# Patient Record
Sex: Male | Born: 1935 | Race: White | Hispanic: No | Marital: Married | Smoking: Never smoker
Health system: Southern US, Community
[De-identification: ages and names within clinical notes are randomized; demographics above are authoritative.]

## PROBLEM LIST (undated history)

## (undated) DIAGNOSIS — M4800 Spinal stenosis, site unspecified: Secondary | ICD-10-CM

## (undated) DIAGNOSIS — K5790 Diverticulosis of intestine, part unspecified, without perforation or abscess without bleeding: Secondary | ICD-10-CM

## (undated) DIAGNOSIS — M1990 Unspecified osteoarthritis, unspecified site: Secondary | ICD-10-CM

## (undated) DIAGNOSIS — K222 Esophageal obstruction: Secondary | ICD-10-CM

## (undated) DIAGNOSIS — K635 Polyp of colon: Secondary | ICD-10-CM

## (undated) DIAGNOSIS — I10 Essential (primary) hypertension: Secondary | ICD-10-CM

## (undated) DIAGNOSIS — R490 Dysphonia: Secondary | ICD-10-CM

## (undated) DIAGNOSIS — K219 Gastro-esophageal reflux disease without esophagitis: Secondary | ICD-10-CM

## (undated) HISTORY — PX: ROTATOR CUFF REPAIR: SHX139

## (undated) HISTORY — PX: PROSTATE SURGERY: SHX751

## (undated) HISTORY — DX: Spinal stenosis, site unspecified: M48.00

## (undated) HISTORY — PX: BACK SURGERY: SHX140

## (undated) HISTORY — DX: Polyp of colon: K63.5

## (undated) HISTORY — PX: KNEE ARTHROSCOPY: SHX127

## (undated) HISTORY — DX: Diverticulosis of intestine, part unspecified, without perforation or abscess without bleeding: K57.90

## (undated) HISTORY — DX: Esophageal obstruction: K22.2

## (undated) HISTORY — PX: APPENDECTOMY: SHX54

---

## 2001-06-11 ENCOUNTER — Encounter: Admission: RE | Admit: 2001-06-11 | Discharge: 2001-06-11 | Payer: Self-pay | Admitting: Orthopaedic Surgery

## 2001-06-11 ENCOUNTER — Emergency Department (HOSPITAL_COMMUNITY): Admission: EM | Admit: 2001-06-11 | Discharge: 2001-06-11 | Payer: Self-pay | Admitting: Emergency Medicine

## 2001-06-11 ENCOUNTER — Encounter: Payer: Self-pay | Admitting: Emergency Medicine

## 2001-06-19 ENCOUNTER — Encounter: Admission: RE | Admit: 2001-06-19 | Discharge: 2001-06-19 | Payer: Self-pay | Admitting: Orthopaedic Surgery

## 2001-09-26 ENCOUNTER — Ambulatory Visit (HOSPITAL_COMMUNITY): Admission: RE | Admit: 2001-09-26 | Discharge: 2001-09-26 | Payer: Self-pay | Admitting: Internal Medicine

## 2001-10-20 ENCOUNTER — Ambulatory Visit (HOSPITAL_COMMUNITY): Admission: RE | Admit: 2001-10-20 | Discharge: 2001-10-20 | Payer: Self-pay | Admitting: Cardiology

## 2002-02-04 ENCOUNTER — Inpatient Hospital Stay (HOSPITAL_COMMUNITY): Admission: RE | Admit: 2002-02-04 | Discharge: 2002-02-05 | Payer: Self-pay | Admitting: Orthopaedic Surgery

## 2007-06-30 ENCOUNTER — Ambulatory Visit: Payer: Self-pay | Admitting: Internal Medicine

## 2007-07-14 ENCOUNTER — Ambulatory Visit: Payer: Self-pay | Admitting: Internal Medicine

## 2007-07-14 ENCOUNTER — Encounter: Payer: Self-pay | Admitting: Internal Medicine

## 2009-10-17 ENCOUNTER — Inpatient Hospital Stay (HOSPITAL_COMMUNITY): Admission: RE | Admit: 2009-10-17 | Discharge: 2009-10-18 | Payer: Self-pay | Admitting: Orthopaedic Surgery

## 2011-01-15 LAB — COMPREHENSIVE METABOLIC PANEL
ALT: 20 U/L (ref 0–53)
Albumin: 4.4 g/dL (ref 3.5–5.2)
Alkaline Phosphatase: 49 U/L (ref 39–117)
Chloride: 103 mEq/L (ref 96–112)
Glucose, Bld: 101 mg/dL — ABNORMAL HIGH (ref 70–99)
Potassium: 4.9 mEq/L (ref 3.5–5.1)
Sodium: 139 mEq/L (ref 135–145)
Total Bilirubin: 1 mg/dL (ref 0.3–1.2)
Total Protein: 6.7 g/dL (ref 6.0–8.3)

## 2011-01-15 LAB — CBC
HCT: 43.5 % (ref 39.0–52.0)
Hemoglobin: 14.4 g/dL (ref 13.0–17.0)
MCHC: 33.2 g/dL (ref 30.0–36.0)
MCV: 85.8 fL (ref 78.0–100.0)
Platelets: 240 10*3/uL (ref 150–400)
RBC: 5.07 MIL/uL (ref 4.22–5.81)
RDW: 15.2 % (ref 11.5–15.5)
WBC: 6.4 10*3/uL (ref 4.0–10.5)

## 2011-01-15 LAB — URINALYSIS, ROUTINE W REFLEX MICROSCOPIC
Bilirubin Urine: NEGATIVE
Glucose, UA: NEGATIVE mg/dL
Hgb urine dipstick: NEGATIVE
Specific Gravity, Urine: 1.009 (ref 1.005–1.030)
pH: 6.5 (ref 5.0–8.0)

## 2011-01-15 LAB — DIFFERENTIAL
Basophils Relative: 1 % (ref 0–1)
Eosinophils Absolute: 0.2 10*3/uL (ref 0.0–0.7)
Monocytes Absolute: 0.5 10*3/uL (ref 0.1–1.0)
Monocytes Relative: 7 % (ref 3–12)

## 2011-03-02 NOTE — Op Note (Signed)
. [facility]  Patient:    Christian [name], Christian [name] Visit Number: [id number] MRN: [id number]          Service Type: SUR Location: 5000 5005 [id number] Attending Physician:  Jacki Cones Dictated by:   Veverly Fells Ophelia Charter, M.D. Proc. Date: 02/04/02 Admit Date:  02/04/2002 Discharge Date: 02/05/2002                             Operative Report  PREOPERATIVE DIAGNOSIS:  L4-5 lumbar spinal stenosis with bilateral foraminal stenosis.  POSTOPERATIVE DIAGNOSIS:  L4-5 lumbar spinal stenosis with bilateral foraminal stenosis.  OPERATION PERFORMED:  L4-5 decompression with foraminotomies.  SURGEON:  Mark C. Ophelia Charter, M.D.  ASSISTANT:  Zonia Kief, PA-C  ANESTHESIA:  GOT.  ESTIMATED BLOOD LOSS:  100 ml.  DESCRIPTION OF PROCEDURE:  After induction of general anesthesia, the patient was placed on the Andrews frame with preoperative Ancef prophylaxis.  His back was prepped with DuraPrep and the patient was carefully position, padded.  The area was squared with towels, Betadine Vi-drape applied and laminectomy sheets and drapes were applied.  Needle localization with a spinal needle, cross-table lateral x-ray confirmed that the needle was exactly over the L4-5 level.  A midline incision was made. Subperiosteal dissection on the lamina was performed.  A self-retaining retractor was placed.  Inferior aspect of the spinous process of L4 was taken and the top one fourth of L5.  There was considerable facet overgrowth even posteriorly and the interlaminar space was extremely small.  Bone was removed down to the base of the spinous process at the inferior one third of L4 and the lamina was thinned with the Kerrison.  There was extremely hypertrophic ligamentum and laminotomy was performed with both the right and left side as well as in the midline removing the inferior one half of the lamina.  The top fourth of the L5 lamina was also removed and bone was removed out to  the gutters.  There was some evidence of previous corticosteroid injections interspersed with the ligamentum and there was a large piece of calcified ligament which was 1 x 1 cm and was sitting on the right side.  This was adherent to the dura and with the operative microscope draped in brought in, microdissection technique was used to remove the calcified ligament, gently using a #4 Penfield to release adhesions off of the dura.  It was peeled off, grasped with a Kerrison and removed.  After that, large chunks of ligament were removed, patties used to protect the dura and there was extremely thick ligament as expected and corresponded with myelo-CT.  Bone spurs hung off the facets toward the midline, causing triangular constriction of the dura.  These were removed back to the level of pedicle without damaging the facet.  Bone was removed out to the pedicle on both the right and left side.  Foramina was palpated.  There was considerable anterolisthesis, step off that corresponded with the preoperative 3 to 4 mm.  The patient was stable in flexion and extension preoperative x-rays and cross-table lateral did show any change in the listhesis.  The disk was not herniated and was sloped at an angle as expected.  The nerve root was carefully inspected, gently retracted and palpated on all sides.  There was no disk material and all ligament had been removed.  The scope was moved around, right and left, up and down to give excellent visualization of  the canal.  Passes with the  hockey stick anterior to the dura was performed in the foramina one last time.  There were no areas of either central or residual foraminal encroachment.  Irrigation was used intermittently throughout the case to keep the dura moist and a final several syringes of irrigation prior to closure.  0 Vicryl was placed in the deep fascia, 2-0 Vicryl in the subcutaneous tissue.  Marcaine infiltration of the skin, skin staple  closure and postoperative dressing.  The instrument and needle counts were correct.  The patient was transferred to the recovery room in stable condition. Dictated by:   Veverly Fells Ophelia Charter, M.D. Attending Physician:  Jacki Cones DD:  02/04/02 TD:  02/04/02 Job: [id number]

## 2011-03-02 NOTE — H&P (Signed)
[place]. [facility]  Patient:    Christian [name], Christian [name] Visit Number: [id number] MRN: [id number]          Service Type: Attending:  Darden Palmer., M.D. Dictated by:   Darden Palmer., M.D. Adm. Date:  10/20/01   CC:         Richard A. Jacky Kindle, M.D.   History and Physical  CHIEF COMPLAINT: For catheterization.  HISTORY OF PRESENT ILLNESS: This is a 77 year old male, seen and admitted for catheterization.  He has had significant low back pain and had progressive exertional fatigue, and some pressure in his chest described as a band-like feeling which would occur after eating a heavy meal, and would be relieved with rest.  Dr. Jacky Kindle did a treadmill test earlier last month, showing an exercise of seven minutes and 44 seconds with 2 mm ST depression anteriorly. He was seen in the office and we recommended catheterization for further evaluation.  PAST MEDICAL HISTORY: Negative for hypertension, hyperlipidemia, or diabetes. Significant for low back pain, and is contemplating surgery.  PAST SURGICAL HISTORY:  1. Appendectomy at age 78.  2. Hemorrhoidectomy in 36.  3. Bakers cyst of right knee July 16, 2001.  ALLERGIES: None.  CURRENT MEDICATIONS: Aspirin.  FAMILY HISTORY: Father died in his 79s of typhoid fever.  Mother died at age 53 with heart disease and asthma.  One son and daughter, who are healthy.  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  He normally is active and gets no regular exercise.  He is a nonsmoker and does not use alcohol to excess.  Drinks occasional caffeine.  REVIEW OF SYSTEMS: Significant for erectile dysfunction, for which he uses Viagra.  He has seen Dr. Marcelyn Bruins for prostatism in the past.  He is hoarse and has complained of significant sinus drainage.  He has low back pain and is contemplating an operation.  Some numbness involving his legs, which is worse when he exercises,  attributed to his back.  PHYSICAL EXAMINATION:  GENERAL: Pleasant male.  VITAL SIGNS: Weight 178-1/2 pounds.  Blood pressure 160/80 sitting, 154/78 standing.  Pulse 78.  SKIN: Warm and dry.  HEENT: Unremarkable.  Pharynx negative.  NECK: Supple without masses, JVD, thyromegaly, or bruits.  LYMPH NODES: Negative.  LUNGS: Clear to A&P.  CARDIAC: Normal S1 and S2, no S3 or murmur.  ABDOMEN: Soft, nontender.  No masses or hepatosplenomegaly.  EXTREMITIES: Femoral and distal pulses present and 2+.  NEUROLOGIC: Normal.  IMPRESSION:  1. Exertional dyspnea and fatigue, with an abnormal treadmill test; rule out     coronary disease.  2. Significant low back pain.  3. Erectile dysfunction.  PLAN: Cardiac catheterization.  The procedure was discussed fully with the patient including risks of MI, death, or CVA, and he is agreeable and willing to proceed.  The possibility of angioplasty and stenting was discussed with the patient. Dictated by:   Darden Palmer., M.D. Attending:  Darden Palmer., M.D. DD:  10/09/01 TD:  10/10/01 Job: [id number]

## 2011-03-02 NOTE — H&P (Signed)
[place]. [facility]  Patient:    WILLIAMSON, CAVANAH Visit Number: [id number] MRN: [id number]          Service Type: SUR Location: 5000 5005 01 Attending Physician:  Jacki Cones Dictated by:   Genene Churn. Barry Dienes, P.A.C. Admit Date:  02/04/2002                           History and Physical  CHIEF COMPLAINT:  Low back pain and bilateral buttock pain.  HISTORY OF PRESENT ILLNESS:  A 77 year old white male with a history of L4-5 stenosis, persistent low back pain, and bilateral buttock pain presents for a preoperative evaluation for an L4-5 decompression and bilateral foraminotomies. He has had progressively worsening pain and has had no response to conservative treatment.  Significant decrease in his daily activity levels due to his ongoing symptoms.  MEDICATIONS: 1. Multivitamin. 2. Aspirin one tablet p.o. q.d. 3. Naprosyn. 4. Altace 2.5 mg p.o. q.d.  ALLERGIES:  TYLOX - nausea and vomiting.  PAST SURGICAL HISTORY: 1. Hemorrhoidectomy in 1975. 2. Right knee arthroscopy in 2002.  FAMILY HISTORY:  Positive for coronary artery disease.  SOCIAL HISTORY:  The patient is married and he is currently retired.  Denies smoking or alcohol consumption.  REVIEW OF SYSTEMS:  Positive for prostate problems.  The patient states that he had a heart catheterization performed in January of 2003 by Dr. Donnie Aho. He states that this test was negative.  He currently denies any chest pain, shortness of breath, or abdominal pain.  All other review of systems are noncontributory.  PHYSICAL EXAMINATION:  VITAL SIGNS:  Temperature 97.2, pulse 72, respirations 20, and blood pressure 149/96.  Height 5 feet 10 inches, weight 170 pounds.  GENERAL:  Pleasant, elderly white male who is alert and oriented x 3 and in no acute distress.  HEENT:  Head is normocephalic and atraumatic.  Extraocular muscles are intact. Tympanic membranes are clear, pearly gray, and intact.   Oral mucosa pink and moist.  NECK:  Full range of motion.  Supple and nontender.  Carotids are intact.  No lymphadenopathy.  LUNGS:  Clear to auscultation bilaterally.  No wheezes, rales, or rhonchi.  HEART:  Respiratory rate.  S1 and S2.  No murmurs, rubs, or gallops noted.  ABDOMEN:  Round and nondistended.  NABS x2.  Soft and nontender.  No palpable masses or organomegaly.  EXTREMITIES:  On examination he has a bilateral sciatic notch tenderness. Demonstrates good range of motion of all extremities.  He has 2+ DTRs. No isolated motor weakness.  Pulses are intact.  SKIN:  Warm and dry.  No lesions or ulcerations noted.  Previous MRI, CT, and myelogram showed critical stenosis and grade I listhesis of L4.  Stenosis at L4-5.  ASSESSMENT:  L4-5 stenosis.  PLAN:  L4-5 decompression and bilateral foraminotomies.  Operative versus nonoperative treatments were discussed with the patient. Possible risks and complications such as bleeding, infection, nerve damage, paralysis, fractures, and reoperation also discussed.  All questions answered and he wishes to proceed with surgery at this time.  We contacted Dr. Tawana Scale office for a surgical clearance and this was received. Dictated by:   Genene Churn. Barry Dienes, P.A.C. Attending Physician:  Jacki Cones DD:  02/04/02 TD:  02/04/02 Job: [phone]

## 2011-03-02 NOTE — Cardiovascular Report (Signed)
[place]. [facility]  Patient:    Christian [name], Christian [name] Visit Number: [id number] MRN: [id number]          Service Type: CAT Location: [facility] 2899 12 Attending Physician:  Norman Clay Dictated by:   Darden Palmer., M.D. Proc. Date: 10/20/01 Admit Date:  10/20/2001   CC:         Richard A. Jacky Kindle, M.D.   Cardiac Catheterization  HISTORY:  This is a 77 year old male with progressive exertional dyspnea.  He has had abnormal stress test and study is done to exclude coronary artery disease as the cause.  COMMENTS ABOUT PROCEDURE:  The patient tolerated the cardiac catheterization procedure well without complications.  Following the procedure, he had good hemostasis and peripheral pulses noted.  HEMODYNAMIC DATA:  Aorta postcontrast 140/80; left ventricle 140/12.  ANGIOGRAPHIC DATA:  LEFT VENTRICULOGRAM:  Performed in the 30 degree RAO projection.  The aortic valve was normal.  The mitral valve was normal.  The left ventricle was normal in size.  The estimated ejection fraction was 65%.  CORONARY ARTERIES:  The coronary arteries arise and distribute normally. There was calcification noted in both the left and the right coronary artery systems.  The left main coronary artery is normal.  The left anterior descending is mildly calcified.  There is a proximal 20-30% stenosis and a 30-40% mid vessel stenosis noted.  The circumflex has scattered irregularities with mild calcification.  The right coronary artery ends in a posterior descending artery and has a mid vessel 30% stenosis.  IMPRESSIONS: 1. Mild coronary artery disease, predominantly involving the left anterior    descending artery and right coronary artery. 2. Normal left ventricular function. Dictated by:   Darden Palmer., M.D. Attending Physician:  Norman Clay DD:  10/20/01 TD:  10/20/01 Job: [id number]

## 2012-06-12 ENCOUNTER — Encounter: Payer: Self-pay | Admitting: Internal Medicine

## 2012-09-23 ENCOUNTER — Encounter: Payer: Self-pay | Admitting: Internal Medicine

## 2012-10-20 ENCOUNTER — Ambulatory Visit: Payer: Self-pay | Admitting: Internal Medicine

## 2012-11-06 ENCOUNTER — Ambulatory Visit (INDEPENDENT_AMBULATORY_CARE_PROVIDER_SITE_OTHER): Payer: Medicare Other | Admitting: Internal Medicine

## 2012-11-06 ENCOUNTER — Encounter: Payer: Self-pay | Admitting: Internal Medicine

## 2012-11-06 VITALS — BP 130/70 | HR 68 | Ht 70.0 in | Wt 172.8 lb

## 2012-11-06 DIAGNOSIS — Z1211 Encounter for screening for malignant neoplasm of colon: Secondary | ICD-10-CM

## 2012-11-06 NOTE — Patient Instructions (Addendum)
Please follow up with Dr. Perry as needed 

## 2012-11-06 NOTE — Progress Notes (Signed)
HISTORY OF PRESENT ILLNESS:  Christian [name] is a 79 y.o. male with the below listed medical history who presents today to discuss surveillance colonoscopy. The patient last underwent complete colonoscopy with good preparation September 2008. He was found to have 3 diminutive colon polyps which were removed and found to be adenomatous. As well severe left-sided diverticulosis. Followup in 5 years to be considered if the patient was medically fit and willing. He received a recall letter and is responding. He has had no significant interval medical problems, except for BPH that required successful TURP. Remains active. His GI review of systems is entirely negative except for history of GERD for which he takes Prilosec with excellent control of symptoms. Remote EGD in 2003 was negative except for large caliber distal esophageal ring. Review patient's outside records reveal unremarkable laboratories from last week including a normal hemoglobin of 13.8. Hemoccult studies are pending.  REVIEW OF SYSTEMS:  All non-GI ROS negative except for back pain  Past Medical History  Diagnosis Date  . Diverticulosis   . Colon polyps   . Esophageal stricture   . Spinal stenosis     Past Surgical History  Procedure Date  . Appendectomy   . Back surgery     x2  . Knee arthroscopy   . Rotator cuff repair   . Prostate surgery     Social History Christian [name]  reports that he has never smoked. He has never used smokeless tobacco. He reports that he does not drink alcohol or use illicit drugs.  family history includes Heart disease in his mother.  No Known Allergies     PHYSICAL EXAMINATION: Vital signs: BP 130/70  Pulse 68  Ht 5\' 10"  (1.778 m)  Wt 172 lb 12.8 oz (78.382 kg)  BMI 24.79 kg/m2 General: Well-developed, well-nourished, no acute distress HEENT: Sclerae are anicteric, conjunctiva pink. Oral mucosa intact Lungs: Clear Heart: Regular Abdomen: soft, nontender, nondistended, no obvious  ascites, no peritoneal signs, normal bowel sounds. No organomegaly. Extremities: No edema Psychiatric: alert and oriented x3. Cooperative      ASSESSMENT:  #1. Personal history of diminutive adenomatous polyps September 2008. Presents today regarding the need for surveillance colonoscopy.   PLAN:  #1. We discussed the pros and cons of proceeding with surveillance colonoscopy at this time. I do feel that he is medically fit to undergo such. It is not clear what if any benefit he would derive at age 79. He is asymptomatic without any worrisome features and normal laboratories. This is preference to forego colonoscopy unless absolutely necessary. I reviewed with him current national guidelines. As such, he has elected to forego the examination at this time. I support this choice. Future followup as needed

## 2015-04-28 ENCOUNTER — Other Ambulatory Visit: Payer: Self-pay | Admitting: Orthopaedic Surgery

## 2015-04-28 DIAGNOSIS — M25561 Pain in right knee: Secondary | ICD-10-CM

## 2015-05-02 ENCOUNTER — Ambulatory Visit
Admission: RE | Admit: 2015-05-02 | Discharge: 2015-05-02 | Disposition: A | Discharge status: Home / Self Care | Payer: Medicare Other | Source: Ambulatory Visit | Attending: Orthopaedic Surgery | Admitting: Orthopaedic Surgery

## 2015-05-02 DIAGNOSIS — M25561 Pain in right knee: Secondary | ICD-10-CM

## 2015-05-13 ENCOUNTER — Other Ambulatory Visit: Payer: Self-pay | Admitting: Surgery

## 2015-05-13 DIAGNOSIS — M545 Low back pain: Secondary | ICD-10-CM

## 2015-05-20 ENCOUNTER — Other Ambulatory Visit (HOSPITAL_COMMUNITY): Payer: Self-pay | Admitting: Orthopaedic Surgery

## 2015-05-24 ENCOUNTER — Other Ambulatory Visit: Payer: Medicare Other

## 2015-05-26 ENCOUNTER — Encounter (HOSPITAL_COMMUNITY)
Admission: RE | Admit: 2015-05-26 | Discharge: 2015-05-26 | Disposition: A | Discharge status: Home / Self Care | Payer: Medicare Other | Source: Ambulatory Visit | Attending: Orthopaedic Surgery | Admitting: Orthopaedic Surgery

## 2015-05-26 ENCOUNTER — Other Ambulatory Visit (HOSPITAL_COMMUNITY): Payer: Medicare Other

## 2015-05-26 ENCOUNTER — Encounter (HOSPITAL_COMMUNITY): Payer: Self-pay

## 2015-05-26 ENCOUNTER — Ambulatory Visit (HOSPITAL_COMMUNITY)
Admission: RE | Admit: 2015-05-26 | Discharge: 2015-05-26 | Disposition: A | Discharge status: Home / Self Care | Payer: Medicare Other | Source: Ambulatory Visit | Attending: Orthopaedic Surgery | Admitting: Orthopaedic Surgery

## 2015-05-26 DIAGNOSIS — Z01818 Encounter for other preprocedural examination: Secondary | ICD-10-CM | POA: Diagnosis present

## 2015-05-26 DIAGNOSIS — M179 Osteoarthritis of knee, unspecified: Secondary | ICD-10-CM | POA: Insufficient documentation

## 2015-05-26 DIAGNOSIS — Z01812 Encounter for preprocedural laboratory examination: Secondary | ICD-10-CM | POA: Insufficient documentation

## 2015-05-26 DIAGNOSIS — I10 Essential (primary) hypertension: Secondary | ICD-10-CM | POA: Diagnosis not present

## 2015-05-26 DIAGNOSIS — R9431 Abnormal electrocardiogram [ECG] [EKG]: Secondary | ICD-10-CM | POA: Insufficient documentation

## 2015-05-26 DIAGNOSIS — M1710 Unilateral primary osteoarthritis, unspecified knee: Secondary | ICD-10-CM

## 2015-05-26 HISTORY — DX: Unspecified osteoarthritis, unspecified site: M19.90

## 2015-05-26 HISTORY — DX: Essential (primary) hypertension: I10

## 2015-05-26 HISTORY — DX: Gastro-esophageal reflux disease without esophagitis: K21.9

## 2015-05-26 HISTORY — DX: Dysphonia: R49.0

## 2015-05-26 LAB — CBC
HEMATOCRIT: 46.1 % (ref 39.0–52.0)
Hemoglobin: 15.6 g/dL (ref 13.0–17.0)
MCH: 30.1 pg (ref 26.0–34.0)
MCHC: 33.8 g/dL (ref 30.0–36.0)
MCV: 89 fL (ref 78.0–100.0)
Platelets: 201 10*3/uL (ref 150–400)
RBC: 5.18 MIL/uL (ref 4.22–5.81)
RDW: 13.9 % (ref 11.5–15.5)
WBC: 7 10*3/uL (ref 4.0–10.5)

## 2015-05-26 LAB — URINALYSIS, ROUTINE W REFLEX MICROSCOPIC
BILIRUBIN URINE: NEGATIVE
GLUCOSE, UA: NEGATIVE mg/dL
Hgb urine dipstick: NEGATIVE
KETONES UR: NEGATIVE mg/dL
Nitrite: NEGATIVE
PROTEIN: NEGATIVE mg/dL
Specific Gravity, Urine: 1.014 (ref 1.005–1.030)
UROBILINOGEN UA: 0.2 mg/dL (ref 0.0–1.0)
pH: 5.5 (ref 5.0–8.0)

## 2015-05-26 LAB — COMPREHENSIVE METABOLIC PANEL
ALT: 22 U/L (ref 17–63)
ANION GAP: 8 (ref 5–15)
AST: 33 U/L (ref 15–41)
Albumin: 4.3 g/dL (ref 3.5–5.0)
Alkaline Phosphatase: 63 U/L (ref 38–126)
BILIRUBIN TOTAL: 0.8 mg/dL (ref 0.3–1.2)
BUN: 20 mg/dL (ref 6–20)
CO2: 27 mmol/L (ref 22–32)
CREATININE: 1.34 mg/dL — AB (ref 0.61–1.24)
Calcium: 9.5 mg/dL (ref 8.9–10.3)
Chloride: 104 mmol/L (ref 101–111)
GFR calc Af Amer: 54 mL/min — ABNORMAL LOW (ref >60)
GFR calc non Af Amer: 47 mL/min — ABNORMAL LOW (ref >60)
Glucose, Bld: 117 mg/dL — ABNORMAL HIGH (ref 65–99)
Potassium: 4.6 mmol/L (ref 3.5–5.1)
Sodium: 139 mmol/L (ref 135–145)
TOTAL PROTEIN: 6.6 g/dL (ref 6.5–8.1)

## 2015-05-26 LAB — PROTIME-INR
INR: 1.05 (ref 0.00–1.49)
PROTHROMBIN TIME: 13.9 s (ref 11.6–15.2)

## 2015-05-26 LAB — SURGICAL PCR SCREEN
MRSA, PCR: NEGATIVE
Staphylococcus aureus: NEGATIVE

## 2015-05-26 LAB — URINE MICROSCOPIC-ADD ON

## 2015-05-26 NOTE — Progress Notes (Signed)
Pt. Followed by Dr. Jacky Kindle for PCP, he will see him on 06/02/2015. Pt. Reports stress test in 2003 led to cardiac cath. Cardiac cath. produced wnl results.

## 2015-05-26 NOTE — Pre-Procedure Instructions (Signed)
Christian [name]  05/26/2015      [facility] [place], [place] - [address] [facility] [address] Phone: [phone] Fax: [phone]    Your procedure is scheduled on 06/06/2015   Report to [facility] Admitting at 10:30 A.M.   Call this number if you have problems the morning of surgery:  [phone]   Remember:  Do not eat food or drink liquids after midnight. On .  Place clean sheets on your bed the night of your first shower and do not sleep with pets.  Day of Surgery  Do not apply any lotions/deodorants the morning of surgery.  Please wear clean clothes to the hospital/surgery center.  Please read over the following fact sheets that you were given. Pain Booklet, Coughing and Deep Breathing, MRSA Information and Surgical Site Infection Prevention

## 2015-05-26 NOTE — Progress Notes (Signed)
Report to A. Kabbe, Anesth. Consult, on the EKG done today at PAT appt.

## 2015-05-26 NOTE — Progress Notes (Signed)
Requested last EKG & last OV note  from Dr. Lanell Matar office, left voice mail for assist in Med. Records.

## 2015-05-27 NOTE — Progress Notes (Signed)
Anesthesia Chart Review:  Pt is 81 year old male scheduled for R computer assisted total knee arthroplasty on 06/06/2015 with Dr. Ophelia Charter.   PCP is Dr. [name].  PMH includes: HTN, GERD. Never smoker. BMI 25.5.  Medications include: lisinopril, prilosec.   Preoperative labs reviewed.    Chest x-ray 05/26/2015 reviewed. Nonspecific elevation of the R diaphragm of uncertain etiology with potential causes including hepatic mass or diaphragmatic paralysis.   EKG 05/26/2015: sinus rhythm with PACs. LAD. Appears similar to prior tracings dated 08/10/2011 and 01/29/2002.   If no changes, I anticipate pt can proceed with surgery as scheduled.   Rica Mast, FNP-BC [facility]/Anesthesiology Phone: [phone] 05/27/2015 1:54 PM

## 2015-06-03 MED ORDER — CHLORHEXIDINE GLUCONATE 4 % EX LIQD: 60.0000 mL | Freq: Once | CUTANEOUS | Status: DC

## 2015-06-03 MED ORDER — CEFAZOLIN SODIUM-DEXTROSE 2-3 GM-% IV SOLR
2.0000 g | INTRAVENOUS | Status: AC
  Administered 2015-06-06: 2 g via INTRAVENOUS
  Filled 2015-06-03: qty 50

## 2015-06-06 ENCOUNTER — Inpatient Hospital Stay (HOSPITAL_COMMUNITY): Payer: Medicare Other | Admitting: Emergency Medicine

## 2015-06-06 ENCOUNTER — Encounter (HOSPITAL_COMMUNITY)
Admission: AD | Disposition: A | Discharge status: Home Health | Payer: Self-pay | Source: Ambulatory Visit | Attending: Orthopaedic Surgery

## 2015-06-06 ENCOUNTER — Inpatient Hospital Stay (HOSPITAL_COMMUNITY)
Admission: AD | Admit: 2015-06-06 | Discharge: 2015-06-09 | DRG: 470 | Disposition: A | Discharge status: Home Health | Payer: Medicare Other | Source: Ambulatory Visit | Attending: Orthopaedic Surgery | Admitting: Orthopaedic Surgery

## 2015-06-06 ENCOUNTER — Encounter (HOSPITAL_COMMUNITY): Payer: Self-pay | Admitting: *Deleted

## 2015-06-06 ENCOUNTER — Inpatient Hospital Stay (HOSPITAL_COMMUNITY): Payer: Medicare Other | Admitting: Anesthesiology

## 2015-06-06 DIAGNOSIS — K219 Gastro-esophageal reflux disease without esophagitis: Secondary | ICD-10-CM | POA: Diagnosis present

## 2015-06-06 DIAGNOSIS — I10 Essential (primary) hypertension: Secondary | ICD-10-CM | POA: Diagnosis present

## 2015-06-06 DIAGNOSIS — M1711 Unilateral primary osteoarthritis, right knee: Principal | ICD-10-CM | POA: Diagnosis present

## 2015-06-06 DIAGNOSIS — Z8249 Family history of ischemic heart disease and other diseases of the circulatory system: Secondary | ICD-10-CM

## 2015-06-06 DIAGNOSIS — M25561 Pain in right knee: Secondary | ICD-10-CM | POA: Diagnosis present

## 2015-06-06 DIAGNOSIS — Z96659 Presence of unspecified artificial knee joint: Secondary | ICD-10-CM

## 2015-06-06 HISTORY — PX: KNEE ARTHROPLASTY: SHX992

## 2015-06-06 SURGERY — ARTHROPLASTY, KNEE, TOTAL, USING IMAGELESS COMPUTER-ASSISTED NAVIGATION
Anesthesia: General | Site: Knee | Laterality: Right

## 2015-06-06 MED ORDER — LISINOPRIL 10 MG PO TABS
10.0000 mg | ORAL_TABLET | Freq: Every day | ORAL | Status: DC
  Administered 2015-06-06 – 2015-06-08 (×3): 10 mg via ORAL
  Filled 2015-06-06 (×4): qty 1

## 2015-06-06 MED ORDER — EPHEDRINE SULFATE 50 MG/ML IJ SOLN
INTRAMUSCULAR | Status: AC
  Filled 2015-06-06: qty 1

## 2015-06-06 MED ORDER — PHENOL 1.4 % MT LIQD: 1.0000 | OROMUCOSAL | Status: DC | PRN

## 2015-06-06 MED ORDER — ONDANSETRON HCL 4 MG/2ML IJ SOLN
4.0000 mg | Freq: Four times a day (QID) | INTRAMUSCULAR | Status: DC | PRN
  Administered 2015-06-06: 4 mg via INTRAVENOUS
  Filled 2015-06-06: qty 2

## 2015-06-06 MED ORDER — MIDAZOLAM HCL 2 MG/2ML IJ SOLN
INTRAMUSCULAR | Status: AC
  Filled 2015-06-06: qty 4

## 2015-06-06 MED ORDER — METOCLOPRAMIDE HCL 5 MG/ML IJ SOLN: 5.0000 mg | Freq: Three times a day (TID) | INTRAMUSCULAR | Status: DC | PRN

## 2015-06-06 MED ORDER — ACETAMINOPHEN 650 MG RE SUPP: 650.0000 mg | Freq: Four times a day (QID) | RECTAL | Status: DC | PRN

## 2015-06-06 MED ORDER — ONDANSETRON HCL 4 MG/2ML IJ SOLN
INTRAMUSCULAR | Status: AC
  Administered 2015-06-06: 4 mg via INTRAVENOUS
  Filled 2015-06-06: qty 2

## 2015-06-06 MED ORDER — SODIUM CHLORIDE 0.9 % IJ SOLN
INTRAMUSCULAR | Status: AC
  Filled 2015-06-06: qty 10

## 2015-06-06 MED ORDER — SODIUM CHLORIDE 0.9 % IR SOLN
Status: DC | PRN
  Administered 2015-06-06: 3000 mL

## 2015-06-06 MED ORDER — DOCUSATE SODIUM 100 MG PO CAPS
100.0000 mg | ORAL_CAPSULE | Freq: Two times a day (BID) | ORAL | Status: DC
  Administered 2015-06-06 – 2015-06-09 (×6): 100 mg via ORAL
  Filled 2015-06-06 (×7): qty 1

## 2015-06-06 MED ORDER — PHENYLEPHRINE HCL 10 MG/ML IJ SOLN
INTRAMUSCULAR | Status: DC | PRN
  Administered 2015-06-06: 40 ug via INTRAVENOUS
  Administered 2015-06-06: 80 ug via INTRAVENOUS

## 2015-06-06 MED ORDER — ASPIRIN EC 325 MG PO TBEC
325.0000 mg | DELAYED_RELEASE_TABLET | Freq: Every day | ORAL | Status: DC
  Administered 2015-06-07 – 2015-06-09 (×3): 325 mg via ORAL
  Filled 2015-06-06 (×3): qty 1

## 2015-06-06 MED ORDER — FENTANYL CITRATE (PF) 100 MCG/2ML IJ SOLN
100.0000 ug | Freq: Once | INTRAMUSCULAR | Status: AC
  Administered 2015-06-06: 100 ug via INTRAVENOUS

## 2015-06-06 MED ORDER — BISACODYL 10 MG RE SUPP: 10.0000 mg | Freq: Every day | RECTAL | Status: DC | PRN

## 2015-06-06 MED ORDER — LIDOCAINE HCL (CARDIAC) 20 MG/ML IV SOLN
INTRAVENOUS | Status: DC | PRN
  Administered 2015-06-06: 50 mg via INTRAVENOUS

## 2015-06-06 MED ORDER — MIDAZOLAM HCL 2 MG/2ML IJ SOLN
INTRAMUSCULAR | Status: AC
  Filled 2015-06-06: qty 2

## 2015-06-06 MED ORDER — ONDANSETRON HCL 4 MG/2ML IJ SOLN
4.0000 mg | Freq: Once | INTRAMUSCULAR | Status: AC
  Administered 2015-06-06: 4 mg via INTRAVENOUS
  Filled 2015-06-06: qty 2

## 2015-06-06 MED ORDER — METHOCARBAMOL 1000 MG/10ML IJ SOLN: 500.0000 mg | Freq: Four times a day (QID) | INTRAMUSCULAR | Status: DC | PRN

## 2015-06-06 MED ORDER — OMEPRAZOLE MAGNESIUM 20 MG PO TBEC: 20.0000 mg | DELAYED_RELEASE_TABLET | Freq: Every day | ORAL | Status: DC

## 2015-06-06 MED ORDER — 0.9 % SODIUM CHLORIDE (POUR BTL) OPTIME
TOPICAL | Status: DC | PRN
  Administered 2015-06-06: 1000 mL

## 2015-06-06 MED ORDER — ONDANSETRON HCL 4 MG/2ML IJ SOLN
INTRAMUSCULAR | Status: AC
  Filled 2015-06-06: qty 2

## 2015-06-06 MED ORDER — LORATADINE 10 MG PO TABS
10.0000 mg | ORAL_TABLET | Freq: Every day | ORAL | Status: DC
  Administered 2015-06-07 – 2015-06-08 (×2): 10 mg via ORAL
  Filled 2015-06-06 (×3): qty 1

## 2015-06-06 MED ORDER — PROPOFOL INFUSION 10 MG/ML OPTIME
INTRAVENOUS | Status: DC | PRN
  Administered 2015-06-06: 75 ug/kg/min via INTRAVENOUS

## 2015-06-06 MED ORDER — ACETAMINOPHEN 325 MG PO TABS: 650.0000 mg | ORAL_TABLET | Freq: Four times a day (QID) | ORAL | Status: DC | PRN

## 2015-06-06 MED ORDER — BUPIVACAINE HCL (PF) 0.5 % IJ SOLN
INTRAMUSCULAR | Status: DC | PRN
  Administered 2015-06-06: 2.5 mL

## 2015-06-06 MED ORDER — POTASSIUM CHLORIDE IN NACL 20-0.45 MEQ/L-% IV SOLN
INTRAVENOUS | Status: DC
Start: 2015-06-06 — End: 2015-06-09
  Administered 2015-06-06: 21:00:00 via INTRAVENOUS
  Filled 2015-06-06 (×7): qty 1000

## 2015-06-06 MED ORDER — GLYCOPYRROLATE 0.2 MG/ML IJ SOLN
INTRAMUSCULAR | Status: AC
  Filled 2015-06-06: qty 1

## 2015-06-06 MED ORDER — HYDROMORPHONE HCL 1 MG/ML IJ SOLN: 0.5000 mg | INTRAMUSCULAR | Status: DC | PRN

## 2015-06-06 MED ORDER — METOCLOPRAMIDE HCL 5 MG PO TABS: 5.0000 mg | ORAL_TABLET | Freq: Three times a day (TID) | ORAL | Status: DC | PRN

## 2015-06-06 MED ORDER — PANTOPRAZOLE SODIUM 40 MG PO TBEC
40.0000 mg | DELAYED_RELEASE_TABLET | Freq: Every day | ORAL | Status: DC
  Administered 2015-06-07 – 2015-06-09 (×3): 40 mg via ORAL
  Filled 2015-06-06 (×3): qty 1

## 2015-06-06 MED ORDER — LIDOCAINE HCL (CARDIAC) 20 MG/ML IV SOLN
INTRAVENOUS | Status: AC
  Filled 2015-06-06: qty 5

## 2015-06-06 MED ORDER — LACTATED RINGERS IV SOLN
INTRAVENOUS | Status: DC
  Administered 2015-06-06 (×3): via INTRAVENOUS

## 2015-06-06 MED ORDER — METHOCARBAMOL 500 MG PO TABS
500.0000 mg | ORAL_TABLET | Freq: Four times a day (QID) | ORAL | Status: DC | PRN
  Administered 2015-06-06 – 2015-06-08 (×4): 500 mg via ORAL
  Filled 2015-06-06 (×4): qty 1

## 2015-06-06 MED ORDER — FENTANYL CITRATE (PF) 100 MCG/2ML IJ SOLN
INTRAMUSCULAR | Status: AC
  Administered 2015-06-06: 100 ug via INTRAVENOUS
  Filled 2015-06-06: qty 2

## 2015-06-06 MED ORDER — MENTHOL 3 MG MT LOZG: 1.0000 | LOZENGE | OROMUCOSAL | Status: DC | PRN

## 2015-06-06 MED ORDER — OXYCODONE HCL 5 MG PO TABS
5.0000 mg | ORAL_TABLET | ORAL | Status: DC | PRN
  Administered 2015-06-06 – 2015-06-08 (×7): 5 mg via ORAL
  Filled 2015-06-06 (×7): qty 1

## 2015-06-06 MED ORDER — ONDANSETRON HCL 4 MG/2ML IJ SOLN
INTRAMUSCULAR | Status: DC | PRN
  Administered 2015-06-06: 4 mg via INTRAVENOUS

## 2015-06-06 MED ORDER — PHENYLEPHRINE 40 MCG/ML (10ML) SYRINGE FOR IV PUSH (FOR BLOOD PRESSURE SUPPORT)
PREFILLED_SYRINGE | INTRAVENOUS | Status: AC
  Filled 2015-06-06: qty 10

## 2015-06-06 MED ORDER — PROPOFOL 10 MG/ML IV BOLUS
INTRAVENOUS | Status: AC
  Filled 2015-06-06: qty 20

## 2015-06-06 MED ORDER — EPHEDRINE SULFATE 50 MG/ML IJ SOLN
INTRAMUSCULAR | Status: DC | PRN
  Administered 2015-06-06: 10 mg via INTRAVENOUS

## 2015-06-06 MED ORDER — ONDANSETRON HCL 4 MG PO TABS
4.0000 mg | ORAL_TABLET | Freq: Four times a day (QID) | ORAL | Status: DC | PRN
Start: 2015-06-06 — End: 2015-06-09
  Administered 2015-06-07: 4 mg via ORAL
  Filled 2015-06-06: qty 1

## 2015-06-06 MED ORDER — DEXTROSE 5 % IV SOLN
10.0000 mg | INTRAVENOUS | Status: DC | PRN
  Administered 2015-06-06: 30 ug/min via INTRAVENOUS
  Administered 2015-06-06: 25 ug/min via INTRAVENOUS

## 2015-06-06 MED ORDER — SENNOSIDES-DOCUSATE SODIUM 8.6-50 MG PO TABS: 1.0000 | ORAL_TABLET | Freq: Every evening | ORAL | Status: DC | PRN

## 2015-06-06 MED ORDER — FENTANYL CITRATE (PF) 250 MCG/5ML IJ SOLN
INTRAMUSCULAR | Status: AC
  Filled 2015-06-06: qty 5

## 2015-06-06 SURGICAL SUPPLY — 66 items
APL SKNCLS STERI-STRIP NONHPOA (GAUZE/BANDAGES/DRESSINGS) ×1
BANDAGE ELASTIC 4 VELCRO ST LF (GAUZE/BANDAGES/DRESSINGS) ×3 IMPLANT
BANDAGE ESMARK 6X9 LF (GAUZE/BANDAGES/DRESSINGS) ×1 IMPLANT
BENZOIN TINCTURE PRP APPL 2/3 (GAUZE/BANDAGES/DRESSINGS) ×3 IMPLANT
BLADE SAGITTAL 25.0X1.19X90 (BLADE) ×2 IMPLANT
BLADE SAGITTAL 25.0X1.19X90MM (BLADE) ×1
BLADE SAW SGTL 13X75X1.27 (BLADE) ×3 IMPLANT
BNDG CMPR 9X6 STRL LF SNTH (GAUZE/BANDAGES/DRESSINGS) ×1
BNDG CMPR MED 10X6 ELC LF (GAUZE/BANDAGES/DRESSINGS) ×1
BNDG ELASTIC 6X10 VLCR STRL LF (GAUZE/BANDAGES/DRESSINGS) ×3 IMPLANT
BNDG ESMARK 6X9 LF (GAUZE/BANDAGES/DRESSINGS) ×3
BOWL SMART MIX CTS (DISPOSABLE) ×3 IMPLANT
CAP KNEE TOTAL 3 SIGMA ×2 IMPLANT
CEMENT HV SMART SET (Cement) ×6 IMPLANT
CLOSURE STERI-STRIP 1/2X4 (GAUZE/BANDAGES/DRESSINGS) ×1
CLSR STERI-STRIP ANTIMIC 1/2X4 (GAUZE/BANDAGES/DRESSINGS) ×3 IMPLANT
COVER SURGICAL LIGHT HANDLE (MISCELLANEOUS) ×3 IMPLANT
CUFF TOURNIQUET SINGLE 34IN LL (TOURNIQUET CUFF) ×3 IMPLANT
DRAPE ORTHO SPLIT 77X108 STRL (DRAPES) ×6
DRAPE SURG ORHT 6 SPLT 77X108 (DRAPES) ×2 IMPLANT
DRAPE U-SHAPE 47X51 STRL (DRAPES) ×3 IMPLANT
DRSG PAD ABDOMINAL 8X10 ST (GAUZE/BANDAGES/DRESSINGS) ×4 IMPLANT
DURAPREP 26ML APPLICATOR (WOUND CARE) ×3 IMPLANT
ELECT REM PT RETURN 9FT ADLT (ELECTROSURGICAL) ×3
ELECTRODE REM PT RTRN 9FT ADLT (ELECTROSURGICAL) ×1 IMPLANT
FACESHIELD WRAPAROUND (MASK) ×9 IMPLANT
FACESHIELD WRAPAROUND OR TEAM (MASK) ×2 IMPLANT
GAUZE SPONGE 4X4 12PLY STRL (GAUZE/BANDAGES/DRESSINGS) ×4 IMPLANT
GAUZE XEROFORM 5X9 LF (GAUZE/BANDAGES/DRESSINGS) ×3 IMPLANT
GLOVE BIOGEL PI IND STRL 8 (GLOVE) ×2 IMPLANT
GLOVE BIOGEL PI INDICATOR 8 (GLOVE) ×4
GLOVE ORTHO TXT STRL SZ7.5 (GLOVE) ×6 IMPLANT
GOWN STRL REUS W/ TWL LRG LVL3 (GOWN DISPOSABLE) ×1 IMPLANT
GOWN STRL REUS W/ TWL XL LVL3 (GOWN DISPOSABLE) ×1 IMPLANT
GOWN STRL REUS W/TWL 2XL LVL3 (GOWN DISPOSABLE) ×3 IMPLANT
GOWN STRL REUS W/TWL LRG LVL3 (GOWN DISPOSABLE) ×3
GOWN STRL REUS W/TWL XL LVL3 (GOWN DISPOSABLE) ×3
HANDPIECE INTERPULSE COAX TIP (DISPOSABLE) ×3
IMMOBILIZER KNEE 22 (SOFTGOODS) ×2 IMPLANT
IMMOBILIZER KNEE 22 UNIV (SOFTGOODS) ×3 IMPLANT
KIT BASIN OR (CUSTOM PROCEDURE TRAY) ×3 IMPLANT
KIT ROOM TURNOVER OR (KITS) ×3 IMPLANT
MANIFOLD NEPTUNE II (INSTRUMENTS) ×3 IMPLANT
MARKER SPHERE PSV REFLC THRD 5 (MARKER) ×9 IMPLANT
NDL HYPO 25GX1X1/2 BEV (NEEDLE) ×1 IMPLANT
NEEDLE HYPO 25GX1X1/2 BEV (NEEDLE) ×3 IMPLANT
NS IRRIG 1000ML POUR BTL (IV SOLUTION) ×3 IMPLANT
PACK TOTAL JOINT (CUSTOM PROCEDURE TRAY) ×3 IMPLANT
PACK UNIVERSAL I (CUSTOM PROCEDURE TRAY) ×3 IMPLANT
PAD ARMBOARD 7.5X6 YLW CONV (MISCELLANEOUS) ×6 IMPLANT
PAD CAST 4YDX4 CTTN HI CHSV (CAST SUPPLIES) ×1 IMPLANT
PADDING CAST COTTON 4X4 STRL (CAST SUPPLIES) ×3
PADDING CAST COTTON 6X4 STRL (CAST SUPPLIES) ×4 IMPLANT
PIN SCHANZ 4MM 130MM (PIN) ×4 IMPLANT
SET HNDPC FAN SPRY TIP SCT (DISPOSABLE) ×1 IMPLANT
STAPLER VISISTAT 35W (STAPLE) IMPLANT
SUCTION FRAZIER TIP 10 FR DISP (SUCTIONS) ×3 IMPLANT
SUT VIC AB 1 CTX 36 (SUTURE) ×6
SUT VIC AB 1 CTX36XBRD ANBCTR (SUTURE) ×2 IMPLANT
SUT VIC AB 2-0 CT1 27 (SUTURE) ×6
SUT VIC AB 2-0 CT1 TAPERPNT 27 (SUTURE) ×2 IMPLANT
SUT VIC AB 3-0 X1 27 (SUTURE) ×4 IMPLANT
SYR CONTROL 10ML LL (SYRINGE) ×3 IMPLANT
TOWEL OR 17X24 6PK STRL BLUE (TOWEL DISPOSABLE) ×3 IMPLANT
TOWEL OR 17X26 10 PK STRL BLUE (TOWEL DISPOSABLE) ×3 IMPLANT
WATER STERILE IRR 1000ML POUR (IV SOLUTION) ×3 IMPLANT

## 2015-06-06 NOTE — Anesthesia Procedure Notes (Addendum)
Anesthesia Regional Block:  Adductor canal block  Pre-Anesthetic Checklist: ,, timeout performed, Correct Patient, Correct Site, Correct Laterality, Correct Procedure, Correct Position, site marked, Risks and benefits discussed, Surgical consent,  Pre-op evaluation,  Post-op pain management  Laterality: Right  Prep: chloraprep       Needles:  Injection technique: Single-shot  Needle Type: Stimiplex     Needle Length: 9cm 9 cm Needle Gauge: 21 and 21 G    Additional Needles:  Procedures: ultrasound guided (picture in chart) Adductor canal block Narrative:  Injection made incrementally with aspirations every 5 mL.  Performed by: Personally  Anesthesiologist: Nolon Nations  Additional Notes: BP cuff, EKG monitors applied. Sedation begun. Artery and nerve location verified with U/S and anesthetic injected incrementally, slowly, and after negative aspirations under direct u/s guidance. Good fascial /perineural spread. Tolerated well.   Spinal Patient location during procedure: OR Staffing Anesthesiologist: Nolon Nations Performed by: anesthesiologist  Preanesthetic Checklist Completed: patient identified, site marked, surgical consent, pre-op evaluation, timeout performed, IV checked, risks and benefits discussed and monitors and equipment checked Spinal Block Patient position: sitting Prep: ChloraPrep Patient monitoring: heart rate, continuous pulse ox and blood pressure Approach: right paramedian Location: L3-4 Injection technique: single-shot Needle Needle type: Sprotte  Needle gauge: 24 G Needle length: 9 cm Additional Notes Expiration date of kit checked and confirmed. Patient tolerated procedure well, without complications.    Procedure Name: MAC Date/Time: 06/06/2015 12:45 PM Performed by: Jenne Campus Pre-anesthesia Checklist: Patient identified, Emergency Drugs available, Suction available, Patient being monitored and Timeout performed Patient  Re-evaluated:Patient Re-evaluated prior to inductionOxygen Delivery Method: Simple face mask

## 2015-06-06 NOTE — Progress Notes (Signed)
Utilization review completed.  

## 2015-06-06 NOTE — Brief Op Note (Signed)
06/06/2015  3:13 PM  PATIENT:  Christian [name]  81 y.o. male  PRE-OPERATIVE DIAGNOSIS:  Right Knee Osteoarthritis   POST-OPERATIVE DIAGNOSIS:  Right Knee Osteoarthritis   PROCEDURE:  Procedure(s): RIGHT COMPUTER ASSISTED TOTAL KNEE ARTHROPLASTY (Right)  SURGEON:  Surgeon(s) and Role:    * Eldred Manges, MD - Primary  PHYSICIAN ASSISTANT: Orlondo Holycross m. Barry Dienes   ANESTHESIA:   regional and spinal  EBL:  Total I/O In: 1800 [I.V.:1800] Out: -   BLOOD ADMINISTERED:none  DRAINS: none   LOCAL MEDICATIONS USED:  NONE  SPECIMEN:  No Specimen  DISPOSITION OF SPECIMEN:  N/A  COUNTS:  YES  TOURNIQUET:   Total Tourniquet Time Documented: Thigh (Right) - 72 minutes Total: Thigh (Right) - 72 minutes   PATIENT DISPOSITION:  PACU - hemodynamically stable.

## 2015-06-06 NOTE — Interval H&P Note (Signed)
History and Physical Interval Note:  06/06/2015 12:15 PM  Christian [name]  has presented today for surgery, with the diagnosis of Right Knee Osteoarthritis   The various methods of treatment have been discussed with the patient and family. After consideration of risks, benefits and other options for treatment, the patient has consented to  Procedure(s): RIGHT COMPUTER ASSISTED TOTAL KNEE ARTHROPLASTY (Right) as a surgical intervention .  The patient's history has been reviewed, patient examined, no change in status, stable for surgery.  I have reviewed the patient's chart and labs.  Questions were answered to the patient's satisfaction.     Saleah Rishel C

## 2015-06-06 NOTE — H&P (Signed)
TOTAL KNEE ADMISSION H&P  Patient is being admitted for right total knee arthroplasty.  Subjective:  Chief Complaint:right knee pain.  HPI: Christian [name], 81 y.o. male, has a history of pain and functional disability in the right knee due to arthritis and has failed non-surgical conservative treatments for greater than 12 weeks to includeNSAID's and/or analgesics, corticosteriod injections, use of assistive devices and activity modification.  Onset of symptoms was gradual, starting 10 years ago with gradually worsening course since that time. knee(s).  Patient currently rates pain in the right knee(s) at 10 out of 10 with activity. Patient has night pain, worsening of pain with activity and weight bearing, pain that interferes with activities of daily living, crepitus and joint swelling.  Patient has evidence of subchondral cysts, subchondral sclerosis, periarticular osteophytes and joint space narrowing by imaging studies. . There is no active infection.  There are no active problems to display for this patient.  Past Medical History  Diagnosis Date  . Diverticulosis   . Colon polyps   . Esophageal stricture   . Spinal stenosis   . Hypertension   . GERD (gastroesophageal reflux disease)   . Arthritis     R knee, L shoulder   . Voice hoarseness     Past Surgical History  Procedure Laterality Date  . Appendectomy    . Knee arthroscopy Left   . Rotator cuff repair Left   . Prostate surgery    . Back surgery      x2    No prescriptions prior to admission   No Known Allergies  Social History  Substance Use Topics  . Smoking status: Never Smoker   . Smokeless tobacco: Never Used  . Alcohol Use: No    Family History  Problem Relation Age of Onset  . Heart disease Mother      ROS  Objective:  Physical Exam  Vital signs in last 24 hours:    Labs:   Estimated body mass index is 24.79 kg/(m^2) as calculated from the following:   Height as of 11/06/12:  (1.778  m).   Weight as of 11/06/12: 78.382 kg (172 lb 12.8 oz).   Imaging Review Plain radiographs demonstrate severe degenerative joint disease of the right knee(s). The overall alignment ismild varus. The bone quality appears to be good for age and reported activity level.  Assessment/Plan:  End stage arthritis, right knee   The patient history, physical examination, clinical judgment of the provider and imaging studies are consistent with end stage degenerative joint disease of the right knee(s) and total knee arthroplasty is deemed medically necessary. The treatment options including medical management, injection therapy arthroscopy and arthroplasty were discussed at length. The risks and benefits of total knee arthroplasty were presented and reviewed. The risks due to aseptic loosening, infection, stiffness, patella tracking problems, thromboembolic complications and other imponderables were discussed. The patient acknowledged the explanation, agreed to proceed with the plan and consent was signed. Patient is being admitted for inpatient treatment for surgery, pain control, PT, OT, prophylactic antibiotics, VTE prophylaxis, progressive ambulation and ADL's and discharge planning. The patient is planning to be discharged home with home health services

## 2015-06-06 NOTE — Progress Notes (Signed)
Orthopedic Tech Progress Note Patient Details:  Christian [name] [birth year] [id number] Applied CPM to RLE.  Applied OHF with trapeze to pt.'s bed. CPM Right Knee CPM Right Knee: On Right Knee Flexion (Degrees): 60 Right Knee Extension (Degrees): 0   [name] 06/06/2015, 3:58 PM

## 2015-06-06 NOTE — Anesthesia Preprocedure Evaluation (Addendum)
Anesthesia Evaluation  Patient identified by MRN, date of birth, ID band Patient awake    Reviewed: Allergy & Precautions, NPO status , Patient's Chart, lab work & pertinent test results  History of Anesthesia Complications Negative for: history of anesthetic complications  Airway Mallampati: II  TM Distance: >3 FB Neck ROM: Full    Dental no notable dental hx.    Pulmonary neg pulmonary ROS,  breath sounds clear to auscultation  Pulmonary exam normal       Cardiovascular hypertension, Pt. on medications Normal cardiovascular examRhythm:Regular Rate:Normal     Neuro/Psych negative neurological ROS  negative psych ROS   GI/Hepatic Neg liver ROS, GERD-  ,  Endo/Other  negative endocrine ROS  Renal/GU negative Renal ROS     Musculoskeletal  (+) Arthritis -,   Abdominal   Peds  Hematology negative hematology ROS (+)   Anesthesia Other Findings Patient is hoarse. Patient stated he had his hoarseness evaluated by and ENT physician. "My vocal cords don't close all the way when I speak".  Reproductive/Obstetrics negative OB ROS                           Anesthesia Physical Anesthesia Plan  ASA: II  Anesthesia Plan:    Post-op Pain Management:    Induction: Intravenous  Airway Management Planned:   Additional Equipment:   Intra-op Plan:   Post-operative Plan:   Informed Consent: I have reviewed the patients History and Physical, chart, labs and discussed the procedure including the risks, benefits and alternatives for the proposed anesthesia with the patient or authorized representative who has indicated his/her understanding and acceptance.   Dental advisory given  Plan Discussed with: CRNA  Anesthesia Plan Comments:         Anesthesia Quick Evaluation

## 2015-06-06 NOTE — Progress Notes (Signed)
Late entry: Patient reports feeling nauseated and started to get sick. Patient denies pain. Dr. Renold Don notified orders received. Patient reports relief with Zofran.

## 2015-06-06 NOTE — Transfer of Care (Signed)
Immediate Anesthesia Transfer of Care Note  Patient: Christian [name]  Procedure(s) Performed: Procedure(s): RIGHT COMPUTER ASSISTED TOTAL KNEE ARTHROPLASTY (Right)  Patient Location: PACU  Anesthesia Type:MAC and Spinal  Level of Consciousness: awake, alert , oriented and patient cooperative  Airway & Oxygen Therapy: Patient Spontanous Breathing and Patient connected to face mask oxygen  Post-op Assessment: Report given to RN and Post -op Vital signs reviewed and stable  Post vital signs: Reviewed  Last Vitals:  Filed Vitals:   06/06/15 1215  BP: 179/99  Pulse: 78  Temp:   Resp: 24    Complications: No apparent anesthesia complications

## 2015-06-07 ENCOUNTER — Encounter (HOSPITAL_COMMUNITY): Payer: Self-pay | Admitting: Orthopaedic Surgery

## 2015-06-07 LAB — BASIC METABOLIC PANEL
ANION GAP: 5 (ref 5–15)
BUN: 17 mg/dL (ref 6–20)
CHLORIDE: 103 mmol/L (ref 101–111)
CO2: 26 mmol/L (ref 22–32)
Calcium: 8.7 mg/dL — ABNORMAL LOW (ref 8.9–10.3)
Creatinine, Ser: 1.24 mg/dL (ref 0.61–1.24)
GFR calc Af Amer: 60 mL/min — ABNORMAL LOW (ref >60)
GFR, EST NON AFRICAN AMERICAN: 52 mL/min — AB (ref >60)
Glucose, Bld: 156 mg/dL — ABNORMAL HIGH (ref 65–99)
POTASSIUM: 4.7 mmol/L (ref 3.5–5.1)
SODIUM: 134 mmol/L — AB (ref 135–145)

## 2015-06-07 LAB — CBC
HCT: 34.9 % — ABNORMAL LOW (ref 39.0–52.0)
HEMOGLOBIN: 11.6 g/dL — AB (ref 13.0–17.0)
MCH: 29 pg (ref 26.0–34.0)
MCHC: 33.2 g/dL (ref 30.0–36.0)
MCV: 87.3 fL (ref 78.0–100.0)
Platelets: 188 10*3/uL (ref 150–400)
RBC: 4 MIL/uL — AB (ref 4.22–5.81)
RDW: 14.1 % (ref 11.5–15.5)
WBC: 7.6 10*3/uL (ref 4.0–10.5)

## 2015-06-07 NOTE — Op Note (Signed)
NAMEMarland Kitchen  Christian, [name] NO.:  [id number]  MEDICAL RECORD NO.:  [id number]  LOCATION:  5N26C                        FACILITY:  [facility]  PHYSICIAN:  Mark C. Ophelia Charter, M.D.    DATE OF BIRTH:  Dec 16, 1928  DATE OF PROCEDURE:  06/06/2015 DATE OF DISCHARGE:                              OPERATIVE REPORT   PREOPERATIVE DIAGNOSIS:  Right knee osteoarthritis.  POSTOPERATIVE DIAGNOSIS:  Right knee osteoarthritis.  PROCEDURE:  Right total knee arthroplasty.  SURGEON:  Mark C. Ophelia Charter, M.D.  ANESTHESIA:  Preoperative adductor block and spinal.  TOURNIQUET TIME:  15 minutes approximately x350.  COMPLICATIONS:  None.  ASSISTANT:  Zonia Kief PA-C, medically necessary and present for the entire procedure.  COMPONENTS:  Laural Benes and Johnson LCS #5 femur, #4 tibia, 10 mm poly rotating platform, 41 mm 3 PEG patellar button.  DESCRIPTION OF PROCEDURE:  After induction of preoperative adductor block, spinal anesthesia, prepping with DuraPrep, proximal thigh tourniquet down the ankle, usual extremity sheets, drapes, impervious stockinette, Coban, sterile skin marker, and Betadine and Steri-Drape were applied.  Time-out procedure was completed and Ancef was given prophylactically.  Leg was wrapped in Esmarch, tourniquet inflated to 350, midline incision was made.  Medial parapatellar incision was performed.  Patella was cut to 10 mm size for 41, 3 holes drilled.  Pins were placed for computer navigation.  Pins were placed inside the incision of the femur.  The patient had flexion contracture and had 8 degrees of varus.  Stab incision was made.  Mid tibia and bicortical pins were placed in the tibia.  __________ were made for the tibia and femur.  Distal femur resected first, taking 10 off the femur and 9.5 taken off high side on the tibia. #5 size femur and chamfer cuts.  Posterior spurs were removed with three cortical curved osteotome and posterior capsule stripped to  the patient's flexion contracture and complete resection of the PCL on posterior capsule.  On the tibial side #4, trials showed that the knee would come out in full extension and wanted to spring back to about 4 degrees.  The femoral guide computer navigation post on the Y-shaped attachment __________ slight wobbling.  Clinically, from the side of the knee, reached full extension, there was no varus or valgus and flexion extension gaps were balanced at 20 mm.  Pulsatile lavage was used for vacuum mixing of the cement, cementing of the tibia followed by femur insertion of the poly.  Cement was hard at 15 minutes.  All excessive cement had been removed.  The knee still reached full extension and would want to balance back to about 4 degrees of flexion but there was complete resection of the posterior capsule and portion of the medial gastrocs had been stripped off the posterior aspect of the femur for complete release with the knee in full flexion. This protected the neurovascular bundle.  Tourniquet was deflated. Hemostasis was obtained.  Standard closure with #1 quad tendon near retinaculum, 2 on the superficial retinaculum, subcutaneous tissue, subcuticular skin closure, tincture of benzoin, Steri-Strips, and postop dressing.  Closure of the computer pin portals mid tibia, postop dressing, and knee immobilizer was applied.  Instrument count and needle  count was correct.     Mark C. Ophelia Charter, M.D.     MCY/MEDQ  D:  06/06/2015  T:  06/07/2015  Job:  [id number]

## 2015-06-07 NOTE — Progress Notes (Signed)
Physical Therapy Treatment Patient Details Name: Christian [name] MRN: [id number] DOB: [birth year] Today's Date: 06/07/2015    History of Present Illness Rt TKA    PT Comments    Patient with improved mobility in afternoon session for ambulation and general mobility. Skilled PT sessions remain appropriate to address gait, strength, ROM, and functional mobility. Continue to anticipate D/C home with Home Health PT for further rehabilitation.   Follow Up Recommendations  Home health PT;Supervision for mobility/OOB     Equipment Recommendations  None recommended by PT;Other (comment)    Recommendations for Other Services       Precautions / Restrictions Precautions Precautions: Knee;Fall Precaution Booklet Issued: Yes (comment) Precaution Comments: reviewed no pillow under knee Required Braces or Orthoses: Knee Immobilizer - Right Knee Immobilizer - Right: On except when in CPM Restrictions Weight Bearing Restrictions: Yes RLE Weight Bearing: Weight bearing as tolerated    Mobility  Bed Mobility Overal bed mobility: Needs Assistance Bed Mobility: Supine to Sit;Sit to Supine     Supine to sit: Supervision;HOB elevated Sit to supine: Supervision;HOB elevated   General bed mobility comments: using rails for assistance  Transfers Overall transfer level: Needs assistance Equipment used: Rolling walker (2 wheeled) Transfers: Sit to/from Stand Sit to Stand: Min assist         General transfer comment:  (Loss of balance posteriorly to bed with initial stand attemp)  Ambulation/Gait Ambulation/Gait assistance: Min guard Ambulation Distance (Feet): 200 Feet Assistive device: Rolling walker (2 wheeled) Gait Pattern/deviations: Step-through pattern Gait velocity: decreased   General Gait Details: cues for posture and even strides   Stairs            Wheelchair Mobility    Modified Rankin (Stroke Patients Only)       Balance Overall balance assessment:  Needs assistance Sitting-balance support: No upper extremity supported Sitting balance-Leahy Scale: Good     Standing balance support: Bilateral upper extremity supported Standing balance-Leahy Scale: Poor                      Cognition Arousal/Alertness: Awake/alert Behavior During Therapy: WFL for tasks assessed/performed Overall Cognitive Status: Within Functional Limits for tasks assessed                      Exercises Total Joint Exercises Ankle Circles/Pumps: AROM;Both;20 reps Quad Sets: Right;Strengthening;10 reps Short Arc Quad: Strengthening;Right;10 reps Heel Slides: AAROM;Right;10 reps Hip ABduction/ADduction: Strengthening;Right;10 reps Straight Leg Raises: Strengthening;Right;10 reps Goniometric ROM: 82 degrees    General Comments        Pertinent Vitals/Pain Pain Assessment: 0-10 Pain Score: 7  Faces Pain Scale: Hurts whole lot Pain Location: Rt knee Pain Descriptors / Indicators: Aching Pain Intervention(s): Monitored during session;Repositioned    Home Living Family/patient expects to be discharged to:: Private residence Living Arrangements: Children;Spouse/significant other Available Help at Discharge: Family;Available PRN/intermittently (wife recently had back surgery, daughter and son-in-law to assist) Type of Home: House Home Access: Stairs to enter Entrance Stairs-Rails: Right Home Layout: One level Home Equipment: Environmental consultant - 2 wheels;Walker - standard;Walker - 4 wheels;Bedside commode;Shower seat - built in Additional Comments: spouse not able to help at home, daughter will be available    Prior Function Level of Independence: Independent with assistive device(s)      Comments: was using rollator walker at times   PT Goals (current goals can now be found in the care plan section) Acute Rehab PT Goals Patient [place] Goal Formulation: With patient Time For Goal Achievement: 06/21/15 Potential to  Achieve Goals: Good Progress towards PT goals: Progressing toward goals    Frequency  7X/week    PT Plan Current plan remains appropriate    Co-evaluation             End of Session Equipment Utilized During Treatment: Gait belt;Right knee immobilizer Activity Tolerance: Patient tolerated treatment well Patient left: in bed;in CPM;with call bell/phone within reach;with family/visitor present     Time: 1610-9604 PT Time Calculation (min) (ACUTE ONLY): 30 min  Charges:  $Gait Training: 8-22 mins $Therapeutic Exercise: 8-22 mins                    G Codes:      [name], PT, CSCS Pager [phone] Office [phone]  06/07/2015, 3:11 PM

## 2015-06-07 NOTE — Progress Notes (Signed)
Subjective: 1 Day Post-Op Procedure(s) (LRB): RIGHT COMPUTER ASSISTED TOTAL KNEE ARTHROPLASTY (Right) Patient reports pain as moderate and severe.    Objective: Vital signs in last 24 hours: Temp:  [97.4 F (36.3 C)-98.6 F (37 C)] 98.1 F (36.7 C) (08/23 0027) Pulse Rate:  [33-100] 67 (08/23 0553) Resp:  [12-24] 16 (08/23 0553) BP: (100-181)/(50-99) 101/50 mmHg (08/23 0553) SpO2:  [92 %-100 %] 95 % (08/23 0553) Weight:  [77.401 kg (170 lb 10.2 oz)] 77.401 kg (170 lb 10.2 oz) (08/22 1036)  Intake/Output from previous day: 08/22 0701 - 08/23 0700 In: 2160 [P.O.:360; I.V.:1800] Out: 950 [Urine:950] Intake/Output this shift:     Recent Labs  06/07/15 0529  HGB 11.6*    Recent Labs  06/07/15 0529  WBC 7.6  RBC 4.00*  HCT 34.9*  PLT 188    Recent Labs  06/07/15 0529  NA 134*  K 4.7  CL 103  CO2 26  BUN 17  CREATININE 1.24  GLUCOSE 156*  CALCIUM 8.7*   No results for input(s): LABPT, INR in the last 72 hours.  Neurologically intact  Assessment/Plan: 1 Day Post-Op Procedure(s) (LRB): RIGHT COMPUTER ASSISTED TOTAL KNEE ARTHROPLASTY (Right) Up with therapy  Dal Blew C 06/07/2015, 7:26 AM

## 2015-06-07 NOTE — Evaluation (Signed)
Physical Therapy Evaluation Patient Details Name: Christian [name] MRN: [id number] DOB: 1929-07-16 Today's Date: 06/07/2015   History of Present Illness  Rt TKA  Clinical Impression  Pt is s/p TKA resulting in the deficits listed below (see PT Problem List).  Pt will benefit from skilled PT to increase their independence and safety with mobility to allow discharge to home with family support.      Follow Up Recommendations Home health PT;Supervision for mobility/OOB    Equipment Recommendations  None recommended by PT;Other (comment) (patient reports having all equipment needs met)    Recommendations for Other Services       Precautions / Restrictions Precautions Precautions: Knee;Fall Precaution Booklet Issued: Yes (comment) Precaution Comments: HEP Required Braces or Orthoses: Knee Immobilizer - Right Knee Immobilizer - Right: On except when in CPM Restrictions Weight Bearing Restrictions: Yes RLE Weight Bearing: Weight bearing as tolerated      Mobility  Bed Mobility Overal bed mobility: Needs Assistance Bed Mobility: Supine to Sit     Supine to sit: Supervision;HOB elevated     General bed mobility comments: using rails for assistance  Transfers Overall transfer level: Needs assistance Equipment used: Rolling walker (2 wheeled) Transfers: Sit to/from Stand Sit to Stand: Min assist         General transfer comment: cues for hand placement before stand/sitting  Ambulation/Gait Ambulation/Gait assistance: Min guard Ambulation Distance (Feet): 50 Feet Assistive device: Rolling walker (2 wheeled) Gait Pattern/deviations: Step-through pattern;Decreased step length - right;Decreased weight shift to right Gait velocity: decreased      Stairs            Wheelchair Mobility    Modified Rankin (Stroke Patients Only)       Balance Overall balance assessment: Needs assistance Sitting-balance support: No upper extremity supported Sitting  balance-Leahy Scale: Fair     Standing balance support: Bilateral upper extremity supported Standing balance-Leahy Scale: Poor Standing balance comment: rolling walker                             Pertinent Vitals/Pain Pain Assessment: 0-10 Pain Score: 8  Pain Location: Rt knee Pain Descriptors / Indicators: Burning Pain Intervention(s): Limited activity within patient's tolerance;Monitored during session;Repositioned    Home Living Family/patient expects to be discharged to:: Private residence Living Arrangements: Children;Spouse/significant other Available Help at Discharge: Family (daughter and son-in-law) Type of Home: House Home Access: Stairs to enter Entrance Stairs-Rails: Right Entrance Stairs-Number of Steps: 8 Home Layout: One level Home Equipment: Environmental consultant - 2 wheels;Walker - standard;Walker - 4 wheels Additional Comments: spouse not able to help at home, daughter will be available    Prior Function Level of Independence: Independent with assistive device(s)         Comments: was using rollator walker at times     Hand Dominance        Extremity/Trunk Assessment               Lower Extremity Assessment: RLE deficits/detail RLE Deficits / Details: fair quad activation, SLR with lag       Communication   Communication: No difficulties  Cognition Arousal/Alertness: Awake/alert Behavior During Therapy: WFL for tasks assessed/performed Overall Cognitive Status: Within Functional Limits for tasks assessed                      General Comments      Exercises Total Joint Exercises Ankle Circles/Pumps: AROM;Both;20 reps Quad Sets:  Right;Strengthening;10 reps Gluteal Sets: Strengthening;Both;10 reps      Assessment/Plan    PT Assessment Patient needs continued PT services  PT Diagnosis Difficulty walking;Generalized weakness;Acute pain   PT Problem List Decreased strength;Decreased range of motion;Decreased activity  tolerance;Decreased balance;Decreased mobility;Pain  PT Treatment Interventions DME instruction;Gait training;Stair training;Functional mobility training;Therapeutic activities;Therapeutic exercise;Balance training;Patient/family education   PT Goals (Current goals can be found in the Care Plan section) Acute Rehab PT Goals Patient Stated Goal: be able to go fishing in october PT Goal Formulation: With patient Time For Goal Achievement: 06/21/15 Potential to Achieve Goals: Good    Frequency 7X/week   Barriers to discharge        Co-evaluation               End of Session Equipment Utilized During Treatment: Gait belt;Right knee immobilizer Activity Tolerance: Patient tolerated treatment well Patient left: in chair;with call bell/phone within reach;Other (comment) (in bone foam) Nurse Communication: Mobility status;Precautions;Weight bearing status         Time: 3837-7939 PT Time Calculation (min) (ACUTE ONLY): 31 min   Charges:   PT Evaluation $Initial PT Evaluation Tier I: 1 Procedure PT Treatments $Gait Training: 8-22 mins   PT G Codes:        Cassell Clement, PT, CSCS Pager [phone] Office [phone]  06/07/2015, 8:59 AM

## 2015-06-07 NOTE — Care Management Note (Signed)
Case Management Note  Patient Details  Name: Christian [name] MRN: [id number] Date of Birth: 08/06/1929  Subjective/Objective:         S/p right total knee arthroplasty           Action/Plan: Spoke with patient about HHC. He will be staying at his daughter's home in [place], he selected [facility] who are currently working with his wife. Contacted Sitara Cashwell with [facility] and set up HHPT, gave her daughter's address. Patient states that he has a rolling walker and a 3N1. No equipment needs identified. Patient stated that he will have family available to assist after discharge.      Expected Discharge Date:                  Expected Discharge Plan:  Home w Home Health Services  In-House Referral:  NA  Discharge planning Services  CM Consult  Post Acute Care Choice:  Home Health Choice offered to:  Patient  DME Arranged:    DME Agency:     HH Arranged:  PT HH Agency:  [facility]  Status of Service:  Completed, signed off  Medicare Important Message Given:    Date Medicare IM Given:    Medicare IM give by:    Date Additional Medicare IM Given:    Additional Medicare Important Message give by:     If discussed at Long Length of Stay Meetings, dates discussed:    Additional Comments:  Monica Becton, RN 06/07/2015, 1:56 PM

## 2015-06-07 NOTE — Progress Notes (Signed)
Orthopedic Tech Progress Note Patient Details:  Christian [name] 1929/03/19 [id number] Off cpm at 7:05 pm Patient ID: [name], male   DOB: 03-Jan-1929, 81 y.o.   MRN: [id number]   Jennye Moccasin 06/07/2015, 7:05 PM

## 2015-06-07 NOTE — Evaluation (Addendum)
Occupational Therapy Evaluation Patient Details Name: Christian [name] MRN: [id number] DOB: 1929/04/12 Today's Date: 06/07/2015    History of Present Illness Rt TKA   Clinical Impression   Patient presenting with decreased ADL, IADL, functional mobility independence secondary to above. Patient independent and driving PTA. Patient currently requires min assist for LB ADLs and supervision>min guard for functional mobility/transfers using RW. Pt's wife recently had back surgery and unable to assist, but pt reports his daughter will be available to assist prn. Patient will benefit from acute OT to increase overall independence in the areas of ADLs, functional mobility, and overall safety in order to safely discharge home with assistance from family.   Pt's 02 sats decreased to as low as 76% on RA with bed mobility. Therapist encouraged rest break and pursed lip breathing and sats quickly increased -> greater than 90%. Encouraged patient not to hold breath during exertion. After ambulation <> BR sats =greater than 90%, but tended to decrease >85% when pt talked. Continued to encourage pursed lip breathing with activity and at rest. Pt hooked up to 02/HR monitor.     Follow Up Recommendations  No OT follow up;Supervision/Assistance - 24 hour    Equipment Recommendations  None recommended by OT    Recommendations for Other Services  None at this time   Precautions / Restrictions Precautions Precautions: Knee;Fall Precaution Booklet Issued: Yes (comment) Precaution Comments: reviewed no pillow under knee Required Braces or Orthoses: Knee Immobilizer - Right Knee Immobilizer - Right: On except when in CPM (or with therapy) Restrictions Weight Bearing Restrictions: Yes RLE Weight Bearing: Weight bearing as tolerated    Mobility Bed Mobility Overal bed mobility: Needs Assistance Bed Mobility: Supine to Sit     Supine to sit: Supervision;HOB elevated     General bed mobility comments:  using rails for assistance  Transfers Overall transfer level: Needs assistance Equipment used: Rolling walker (2 wheeled) Transfers: Sit to/from Stand Sit to Stand: Min guard General transfer comment: cues for hand placement before stand/sitting, increased pain during sit<>stands in RLE.    Balance Overall balance assessment: Needs assistance Sitting-balance support: No upper extremity supported;Feet supported Sitting balance-Leahy Scale: Fair     Standing balance support: Bilateral upper extremity supported;During functional activity Standing balance-Leahy Scale: Fair Standing balance comment: rolling walker    ADL Overall ADL's : Needs assistance/impaired General ADL Comments: Pt min assist with LB ADLs and supervision>min guard with functional mobility/transfers using RW. Pt engaged in bed mobility, sat EOB, stood with RW, and ambulated into BR for toilet transfer using 3-in-1. Left pt seated EOB eating lunch with friend in room.     Pertinent Vitals/Pain Pain Assessment: Faces Pain Score: 8  Faces Pain Scale: Hurts whole lot Pain Location: right knee with mobility/movement  Pain Descriptors / Indicators: Discomfort;Grimacing;Guarding Pain Intervention(s): Limited activity within patient's tolerance;Monitored during session;Repositioned     Hand Dominance Right   Extremity/Trunk Assessment Upper Extremity Assessment Upper Extremity Assessment: Overall WFL for tasks assessed   Lower Extremity Assessment Lower Extremity Assessment: Defer to PT evaluation RLE Deficits / Details: fair quad activation, SLR with lag   Cervical / Trunk Assessment Cervical / Trunk Assessment: Normal   Communication Communication Communication: No difficulties   Cognition Arousal/Alertness: Awake/alert Behavior During Therapy: WFL for tasks assessed/performed Overall Cognitive Status: Within Functional Limits for tasks assessed              Home Living Family/patient expects to be  discharged to:: Private residence Living Arrangements: Children;Spouse/significant other  Available Help at Discharge: Family;Available PRN/intermittently (wife recently had back surgery, daughter and son-in-law to assist) Type of Home: House Home Access: Stairs to enter [employer] of Steps: 8 Entrance Stairs-Rails: Right Home Layout: One level     Bathroom Shower/Tub: Walk-in shower;Door   [employer] Toilet: Standard     Home Equipment: Environmental consultant - 2 wheels;Walker - standard;Walker - 4 wheels;Bedside commode;Shower seat - built in   Additional Comments: spouse not able to help at home, daughter will be available      Prior Functioning/Environment Level of Independence: Independent with assistive device(s)  Comments: was using rollator walker at times    OT Diagnosis: Generalized weakness;Acute pain   OT Problem List: Decreased strength;Decreased range of motion;Decreased activity tolerance;Impaired balance (sitting and/or standing);Decreased safety awareness;Pain;Decreased knowledge of use of DME or AE;Decreased knowledge of precautions   OT Treatment/Interventions: Self-care/ADL training;Therapeutic exercise;Energy conservation;DME and/or AE instruction;Therapeutic activities;Patient/family education;Balance training    OT Goals(Current goals can be found in the care plan section) Acute Rehab OT Goals Patient Stated Goal: none stated OT Goal Formulation: With patient Time For Goal Achievement: 06/21/15 Potential to Achieve Goals: Good ADL Goals Pt Will Perform Grooming: with modified independence;standing Pt Will Perform Lower Body Bathing: with modified independence;sit to/from stand;with adaptive equipment Pt Will Perform Lower Body Dressing: with modified independence;sit to/from stand;with adaptive equipment Pt Will Transfer to Toilet: with modified independence;ambulating;bedside commode Pt Will Perform Tub/Shower Transfer: Shower transfer;3 in 1;rolling  walker;with modified independence;ambulating Additional ADL Goal #1: Pt will independently verbalize and adhere to knee precautions 100% of the time  OT Frequency: Min 2X/week   Barriers to D/C: None known at this time   End of Session Equipment Utilized During Treatment: Rolling walker;Gait belt CPM Right Knee CPM Right Knee: Off  Activity Tolerance: Patient tolerated treatment well Patient left: in bed;with call bell/phone within reach;with family/visitor present (seated EOB eating lunch)   Time: 1610-9604 OT Time Calculation (min): 19 min Charges:  OT General Charges $OT Visit: 1 Procedure OT Evaluation $Initial OT Evaluation Tier I: 1 Procedure  Alaja Goldinger , MS, OTR/L, CLT Pager: [phone]  06/07/2015, 12:33 PM

## 2015-06-08 LAB — CBC
HEMATOCRIT: 32.1 % — AB (ref 39.0–52.0)
HEMOGLOBIN: 11.1 g/dL — AB (ref 13.0–17.0)
MCH: 29.6 pg (ref 26.0–34.0)
MCHC: 34.6 g/dL (ref 30.0–36.0)
MCV: 85.6 fL (ref 78.0–100.0)
Platelets: 159 10*3/uL (ref 150–400)
RBC: 3.75 MIL/uL — ABNORMAL LOW (ref 4.22–5.81)
RDW: 13.8 % (ref 11.5–15.5)
WBC: 7.6 10*3/uL (ref 4.0–10.5)

## 2015-06-08 LAB — BASIC METABOLIC PANEL
ANION GAP: 7 (ref 5–15)
BUN: 15 mg/dL (ref 6–20)
CALCIUM: 8.2 mg/dL — AB (ref 8.9–10.3)
CHLORIDE: 96 mmol/L — AB (ref 101–111)
CO2: 25 mmol/L (ref 22–32)
Creatinine, Ser: 1.11 mg/dL (ref 0.61–1.24)
GFR calc non Af Amer: 59 mL/min — ABNORMAL LOW (ref >60)
GLUCOSE: 132 mg/dL — AB (ref 65–99)
POTASSIUM: 4.4 mmol/L (ref 3.5–5.1)
Sodium: 128 mmol/L — ABNORMAL LOW (ref 135–145)

## 2015-06-08 MED ORDER — HYDROCODONE-ACETAMINOPHEN 10-325 MG PO TABS
1.0000 | ORAL_TABLET | ORAL | Status: DC | PRN
  Administered 2015-06-08 – 2015-06-09 (×4): 1 via ORAL
  Filled 2015-06-08 (×4): qty 1

## 2015-06-08 NOTE — Progress Notes (Signed)
Occupational Therapy Treatment Patient Details Name: Christian [name] MRN: [id number] DOB: 06/16/1929 Today's Date: 06/08/2015    History of present illness Rt TKA   OT comments  Patient progressing slowly towards goals, will continue plan of care for now. Pt with increased pain this session(10/10 in right quad - "soreness"). Pt stated he got confused yesterday evening, question medication? Vs sun downers?. Daughter stated she called and noticed he was confused, therapist encouraged daughter to notify nursing if this happens again. Educated patient and daughter on no pillow under knee with use of zero knee foam and KI for OOB mobility; pt unable to do a straight leg raise independently. Daughter states pt and his wife will be living with her until able to move back home, daughter states she can provide 24/7 supervision/assistance.    Follow Up Recommendations  No OT follow up;Supervision/Assistance - 24 hour    Equipment Recommendations  None recommended by OT    Recommendations for Other Services  None at this time   Precautions / Restrictions Precautions Precautions: Knee;Fall Precaution Comments: reviewed no pillow under knee and zero foam to pt and daughter Required Braces or Orthoses: Knee Immobilizer - Right (educated daughter on [place]) Knee Immobilizer - Right: On except when in CPM Restrictions Weight Bearing Restrictions: Yes RLE Weight Bearing: Weight bearing as tolerated    Mobility Bed Mobility Overal bed mobility: Needs Assistance Bed Mobility: Supine to Sit     Supine to sit: Supervision;HOB elevated     General bed mobility comments: using rails for assistance  Transfers Overall transfer level: Needs assistance Equipment used: Rolling walker (2 wheeled) Transfers: Sit to/from Stand Sit to Stand: Mod assist         General transfer comment: Mod assist to help lift/lower for regular height surface (bed). Pt min assist from [facility]. Cues for hand placement and  technique needed.    Balance Overall balance assessment: Needs assistance Sitting-balance support: No upper extremity supported;Feet supported Sitting balance-Leahy Scale: Fair     Standing balance support: Bilateral upper extremity supported;During functional activity Standing balance-Leahy Scale: Poor   ADL Overall ADL's : Needs assistance/impaired General ADL Comments: Pt continues to need assistance with LB ADLs secondary to recent right TKA. Pt with increased pain this session and with complaints of being confused last night (sun downers?). Daughter present and educated her on shower stall transfers using RW, toilet transfers using BSC, LB ADLs using AE prn.      Cognition   Behavior During Therapy: WFL for tasks assessed/performed Overall Cognitive Status: Within Functional Limits for tasks assessed (reports being confused last night, "I thought I was in [place]")                 Pertinent Vitals/ Pain       Pain Assessment: 0-10 Pain Score: 10-Worst pain ever Pain Location: right knee (quad) Pain Descriptors / Indicators: Sore Pain Intervention(s): Limited activity within patient's tolerance;Monitored during session;Repositioned;Ice applied;Premedicated before session   Frequency Min 2X/week     Progress Toward Goals  OT Goals(current goals can now befound in the care plan section)  Progress towards OT goals: Progressing toward goals     Plan Discharge plan remains appropriate    End of Session Equipment Utilized During Treatment: Rolling walker;Right knee immobilizer   Activity Tolerance Patient tolerated treatment well   Patient Left in chair;with call bell/phone within reach;with family/visitor present    Time: 4098-1191 OT Time Calculation (min): 32 min  Charges: OT General Charges $OT  Visit: 1 Procedure OT Treatments $Self Care/Home Management : 23-37 mins  Ree Alcalde , MS, OTR/L, CLT Pager: [phone]  06/08/2015, 10:45 AM

## 2015-06-08 NOTE — Clinical Documentation Improvement (Signed)
   Abnormal Lab/Test Results:  *8/23 sodium 134, 8/24 sodium 128**  Possible Clinical Conditions associated with below indicators  *Hyponatremia*  Hypernatremia  *Normal sodium value  Other Condition  Cannot Clinically Determine   Please exercise your independent, professional judgment when responding. A specific answer is not anticipated or expected.   Thank You,   Alesia Richards, RN CDS [facility] Health Information Management [email or web address] [phone]

## 2015-06-08 NOTE — Progress Notes (Signed)
Physical Therapy Treatment Patient Details Name: Christian [name] MRN: [id number] DOB: [birth year] Today's Date: 06/08/2015    History of Present Illness Rt TKA    PT Comments    Patient with improved mobility and activity tolerance this afternoon. Able to ambulate 200 feet and go up/down 5 steps. Focus of sessions is on improving mobility for D/C home with family support.   Follow Up Recommendations  Home health PT;Supervision for mobility/OOB     Equipment Recommendations  None recommended by PT;Other (comment)    Recommendations for Other Services       Precautions / Restrictions Precautions Precautions: Knee;Fall Precaution Booklet Issued: Yes (comment) Required Braces or Orthoses: Knee Immobilizer - Right Knee Immobilizer - Right: On except when in CPM Restrictions Weight Bearing Restrictions: Yes RLE Weight Bearing: Weight bearing as tolerated    Mobility  Bed Mobility Overal bed mobility: Needs Assistance Bed Mobility: Supine to Sit     Supine to sit: Supervision Sit to supine: Supervision   General bed mobility comments: found and returned to chair  Transfers Overall transfer level: Needs assistance Equipment used: Rolling walker (2 wheeled) Transfers: Sit to/from Stand Sit to Stand: Min assist         General transfer comment: cues for hand placement for safety  Ambulation/Gait Ambulation/Gait assistance: Min guard Ambulation Distance (Feet): 200 Feet Assistive device: Rolling walker (2 wheeled) Gait Pattern/deviations: Step-through pattern;Decreased step length - right;Decreased weight shift to right Gait velocity: decreased   General Gait Details: cues for posture and even strides, staying within walker   Stairs Stairs: Yes Stairs assistance: Min guard Stair Management: Two rails Number of Stairs: 5 General stair comments: reports similar stairs at home. Son supervising session.  Wheelchair Mobility    Modified Rankin (Stroke  Patients Only)       Balance Overall balance assessment: Needs assistance Sitting-balance support: No upper extremity supported Sitting balance-Leahy Scale: Fair     Standing balance support: Bilateral upper extremity supported Standing balance-Leahy Scale: Poor Standing balance comment: using rw                    Cognition Arousal/Alertness: Awake/alert Behavior During Therapy: WFL for tasks assessed/performed Overall Cognitive Status: Within Functional Limits for tasks assessed                      Exercises Total Joint Exercises Ankle Circles/Pumps: AROM;Both;20 reps Quad Sets: Right;Strengthening;10 reps Gluteal Sets: Strengthening;Both;10 reps Short Arc Quad: Strengthening;Right;10 reps Heel Slides: AAROM;Right;10 reps Hip ABduction/ADduction: Strengthening;Right;10 reps Straight Leg Raises: Strengthening;Right;5 reps Knee Flexion: AAROM;Right;5 reps Goniometric ROM: 79 degrees    General Comments General comments (skin integrity, edema, etc.): Patient with complaints of feeling unsteady today and having more pain.       Pertinent Vitals/Pain Pain Assessment: 0-10 Pain Score: 3  Pain Location: Rt knee Pain Descriptors / Indicators: Sore Pain Intervention(s): Monitored during session    Home Living                      Prior Function            PT Goals (current goals can now be found in the care plan section) Acute Rehab PT Goals [place] Goal Formulation: With patient Time For Goal Achievement: 06/21/15 Potential to Achieve Goals: Good Progress towards PT goals: Progressing toward goals    Frequency  7X/week    PT Plan Current plan remains  appropriate    Co-evaluation             End of Session Equipment Utilized During Treatment: Gait belt;Right knee immobilizer Activity Tolerance: Patient tolerated treatment well Patient left: in chair;with call bell/phone within reach;with  family/visitor present;Other (comment)     Time: 1610-9604 PT Time Calculation (min) (ACUTE ONLY): 40 min  Charges:  $Gait Training: 23-37 mins $Therapeutic Exercise: 8-22 mins                    G Codes:      Christian [name], PT, CSCS Pager [phone] Office [phone]  06/08/2015, 4:54 PM

## 2015-06-08 NOTE — Anesthesia Postprocedure Evaluation (Signed)
Anesthesia Post Note  Patient: Christian [name]  Procedure(s) Performed: Procedure(s) (LRB): RIGHT COMPUTER ASSISTED TOTAL KNEE ARTHROPLASTY (Right)  Anesthesia type: Spinal  Patient location: PACU  Post pain: Pain level controlled  Post assessment: Post-op Vital signs reviewed  Last Vitals: BP 146/74 mmHg  Pulse 95  Temp(Src) 36.3 C (Oral)  Resp 18  Ht  (1.778 m)  Wt 170 lb 10.2 oz (77.401 kg)  BMI 24.48 kg/m2  SpO2 96%  Post vital signs: Reviewed  Level of consciousness: sedated  Complications: No apparent anesthesia complications

## 2015-06-08 NOTE — Progress Notes (Signed)
Orthopedic Tech Progress Note Patient Details:  NICKSON MIDDLESWORTH [birth year] [id number] On cpm at 7:30 pm increased 5 degrees. Patient ID: SIONE BAUMGARTEN, male   DOB: [birth year], 81 y.o.   MRN: [id number]   Jennye Moccasin 06/08/2015, 7:36 PM

## 2015-06-08 NOTE — Progress Notes (Signed)
Physical Therapy Treatment Patient Details Name: Christian [name] MRN: [id number] DOB: 02-07-1929 Today's Date: 06/08/2015    History of Present Illness Rt TKA    PT Comments    Patient with reports of increased pain and feeling unsteady with ambulation. Decreased ambulation today compared to previous session. Patient's daughter present throughout session. Will continue with skilled PT sessions for progression of mobility, exercise and safety.   Follow Up Recommendations  Home health PT;Supervision for mobility/OOB     Equipment Recommendations  None recommended by PT;Other (comment)    Recommendations for Other Services       Precautions / Restrictions Precautions Precautions: Knee;Fall Precaution Booklet Issued: Yes (comment) Precaution Comments: reviewed no pillow under knee and zero foam to pt and daughter Required Braces or Orthoses: Knee Immobilizer - Right Knee Immobilizer - Right: On except when in CPM Restrictions Weight Bearing Restrictions: Yes RLE Weight Bearing: Weight bearing as tolerated    Mobility  Bed Mobility Overal bed mobility: Needs Assistance Bed Mobility: Supine to Sit     Supine to sit: Supervision;HOB elevated     General bed mobility comments: found and returned to chair  Transfers Overall transfer level: Needs assistance Equipment used: Rolling walker (2 wheeled) Transfers: Sit to/from Stand Sit to Stand: Mod assist         General transfer comment: Mod assist to help lift/lower for regular height surface (bed). Pt min assist from [facility]. Cues for hand placement and technique needed.  Ambulation/Gait Ambulation/Gait assistance: Min guard Ambulation Distance (Feet): 25 Feet Assistive device: Rolling walker (2 wheeled) Gait Pattern/deviations: Step-through pattern;Decreased step length - right;Decreased weight shift to right Gait velocity: decreased   General Gait Details: cues for posture and even strides   Stairs             Wheelchair Mobility    Modified Rankin (Stroke Patients Only)       Balance Overall balance assessment: Needs assistance Sitting-balance support: No upper extremity supported Sitting balance-Leahy Scale: Fair     Standing balance support: Bilateral upper extremity supported Standing balance-Leahy Scale: Poor Standing balance comment: mild posterior lean with initial standing                    Cognition Arousal/Alertness: Awake/alert Behavior During Therapy: WFL for tasks assessed/performed Overall Cognitive Status: Within Functional Limits for tasks assessed                      Exercises Total Joint Exercises Ankle Circles/Pumps: AROM;Both;20 reps Quad Sets: Right;Strengthening;10 reps Gluteal Sets: Strengthening;Both;10 reps Short Arc Quad: Strengthening;Right;10 reps (mod assist) Heel Slides: AAROM;Right;10 reps Straight Leg Raises: Strengthening;Right;10 reps (max assist)    General Comments General comments (skin integrity, edema, etc.): Patient with complaints of feeling unsteady today and having more pain.       Pertinent Vitals/Pain Pain Assessment: 0-10 Pain Score: 8  Pain Location: Rt knee and thigh Pain Descriptors / Indicators: Aching;Sore Pain Intervention(s): Monitored during session;Ice applied    Home Living                      Prior Function            PT Goals (current goals can now be found in the care plan section) Acute Rehab PT Goals [place] Goal Formulation: With patient Time For Goal Achievement: 06/21/15 Potential to Achieve Goals: Good Progress towards PT goals: Not progressing toward  goals - comment (decreased mobility today with increased pain reports)    Frequency  7X/week    PT Plan Current plan remains appropriate    Co-evaluation             End of Session Equipment Utilized During Treatment: Gait belt;Right knee immobilizer Activity Tolerance:  Patient limited by pain;Patient limited by fatigue Patient left: in chair;with call bell/phone within reach;with family/visitor present;Other (comment) (in bone foam )     Time: 1610-9604 PT Time Calculation (min) (ACUTE ONLY): 35 min  Charges:  $Gait Training: 8-22 mins $Therapeutic Exercise: 8-22 mins                    G Codes:      [name], PT, CSCS Pager [phone] Office [phone]  06/08/2015, 2:11 PM

## 2015-06-08 NOTE — Plan of Care (Signed)
Problem: Consults Goal: Diagnosis- Total Joint Replacement Primary Total Knee     

## 2015-06-08 NOTE — Progress Notes (Signed)
Subjective: Doing ok.  Pain controlled.  Denies cp, sob.      Objective: Vital signs in last 24 hours: Temp:  [97.4 F (36.3 C)-98.5 F (36.9 C)] 97.4 F (36.3 C) (08/24 0559) Pulse Rate:  [77-95] 95 (08/24 0559) Resp:  [18] 18 (08/24 0559) BP: (101-147)/(50-74) 146/74 mmHg (08/24 0559) SpO2:  [95 %-98 %] 96 % (08/24 0559)  Intake/Output from previous day: 08/23 0701 - 08/24 0700 In: 1190 [P.O.:1190] Out: 100 [Urine:100] Intake/Output this shift:     Recent Labs  06/07/15 0529 06/08/15 0432  HGB 11.6* 11.1*    Recent Labs  06/07/15 0529 06/08/15 0432  WBC 7.6 7.6  RBC 4.00* 3.75*  HCT 34.9* 32.1*  PLT 188 159    Recent Labs  06/07/15 0529 06/08/15 0432  NA 134* 128*  K 4.7 4.4  CL 103 96*  CO2 26 25  BUN 17 15  CREATININE 1.24 1.11  GLUCOSE 156* 132*  CALCIUM 8.7* 8.2*   No results for input(s): LABPT, INR in the last 72 hours.  Exam:  Somewhat drowsy, but was able to arouse him.  Knee wound looks good, steris intact.  No drainage or signs of infection.  Calf nontender, NVI.    Assessment/Plan: Will see how he does with PT today.  Question d/c home tomorrow.  Will change pain meds.     Christian [name] M 06/08/2015, 8:56 AM

## 2015-06-09 LAB — BASIC METABOLIC PANEL
Anion gap: 8 (ref 5–15)
BUN: 15 mg/dL (ref 6–20)
CALCIUM: 8.3 mg/dL — AB (ref 8.9–10.3)
CHLORIDE: 93 mmol/L — AB (ref 101–111)
CO2: 26 mmol/L (ref 22–32)
CREATININE: 0.99 mg/dL (ref 0.61–1.24)
GFR calc Af Amer: >60 mL/min (ref >60)
GFR calc non Af Amer: >60 mL/min (ref >60)
GLUCOSE: 114 mg/dL — AB (ref 65–99)
Potassium: 4.4 mmol/L (ref 3.5–5.1)
Sodium: 127 mmol/L — ABNORMAL LOW (ref 135–145)

## 2015-06-09 LAB — CBC
HEMATOCRIT: 31.9 % — AB (ref 39.0–52.0)
HEMOGLOBIN: 11 g/dL — AB (ref 13.0–17.0)
MCH: 29.2 pg (ref 26.0–34.0)
MCHC: 34.5 g/dL (ref 30.0–36.0)
MCV: 84.6 fL (ref 78.0–100.0)
Platelets: 175 10*3/uL (ref 150–400)
RBC: 3.77 MIL/uL — ABNORMAL LOW (ref 4.22–5.81)
RDW: 13.6 % (ref 11.5–15.5)
WBC: 7.3 10*3/uL (ref 4.0–10.5)

## 2015-06-09 MED ORDER — ASPIRIN 325 MG PO TBEC: 325.0000 mg | DELAYED_RELEASE_TABLET | Freq: Every day | ORAL | Status: DC

## 2015-06-09 MED ORDER — HYDROCODONE-ACETAMINOPHEN 10-325 MG PO TABS: 1.0000 | ORAL_TABLET | Freq: Four times a day (QID) | ORAL | Status: DC | PRN

## 2015-06-09 NOTE — Discharge Instructions (Signed)

## 2015-06-09 NOTE — Progress Notes (Signed)
Christian [name] discharged home per MD order. Discharge instructions reviewed and discussed with patient. All questions and concerns answered. Copy of instructions and scripts given to patient. IV removed.  Patient escorted to car by staff in a wheelchair. No distress noted upon discharge.   Christian [name] 06/09/2015 12:59 PM

## 2015-06-09 NOTE — Progress Notes (Signed)
Subjective: Seen with Dr Ophelia Charter.  Doing well. Pain controlled.  Ready to go home.    Objective: Vital signs in last 24 hours: Temp:  [98.3 F (36.8 C)-98.4 F (36.9 C)] 98.3 F (36.8 C) (08/25 0447) Pulse Rate:  [83-90] 90 (08/25 0447) Resp:  [18] 18 (08/25 0447) BP: (122-142)/(61-72) 142/72 mmHg (08/25 0447) SpO2:  [96 %-97 %] 96 % (08/25 0447)  Intake/Output from previous day: 08/24 0701 - 08/25 0700 In: 890 [P.O.:890] Out: 1525 [Urine:1525] Intake/Output this shift:     Recent Labs  06/07/15 0529 06/08/15 0432 06/09/15 0506  HGB 11.6* 11.1* 11.0*    Recent Labs  06/08/15 0432 06/09/15 0506  WBC 7.6 7.3  RBC 3.75* 3.77*  HCT 32.1* 31.9*  PLT 159 175    Recent Labs  06/08/15 0432 06/09/15 0506  NA 128* 127*  K 4.4 4.4  CL 96* 93*  CO2 25 26  BUN 15 15  CREATININE 1.11 0.99  GLUCOSE 132* 114*  CALCIUM 8.2* 8.3*   No results for input(s): LABPT, INR in the last 72 hours.  Exam:  Alert and oriented.  Wound looks good.  No signs of infection.  Nvi.   Assessment/Plan: D/c home today.  Scripts norco and robaxin on chart.  F/u in office 2 weeks postop.    Christian [name] M 06/09/2015, 8:14 AM

## 2015-06-09 NOTE — Progress Notes (Signed)
Physical Therapy Treatment Patient Details Name: Christian [name] MRN: [id number] DOB: 27-Jun-1929 Today's Date: 06/09/2015    History of Present Illness Rt TKA    PT Comments    Patient is making good progress with PT.  From a mobility standpoint anticipate patient will be ready for DC home. Patient and daughter deny any questions or concerns. Recommending supervision with all activity out of bed at this time. Patient and daughter agree.       Follow Up Recommendations  Home health PT;Supervision for mobility/OOB     Equipment Recommendations  None recommended by PT;Other (comment)    Recommendations for Other Services       Precautions / Restrictions Precautions Precautions: Knee;Fall Precaution Booklet Issued: Yes (comment) Precaution Comments: reviewed HEP Required Braces or Orthoses: Knee Immobilizer - Right Knee Immobilizer - Right: On except when in CPM Restrictions Weight Bearing Restrictions: Yes RLE Weight Bearing: Weight bearing as tolerated    Mobility  Bed Mobility Overal bed mobility: Needs Assistance Bed Mobility: Supine to Sit     Supine to sit: Supervision     General bed mobility comments: bed flat no rails  Transfers Overall transfer level: Needs assistance Equipment used: Rolling walker (2 wheeled) Transfers: Sit to/from Stand Sit to Stand: Supervision            Ambulation/Gait Ambulation/Gait assistance: Supervision Ambulation Distance (Feet): 120 Feet Assistive device: Rolling walker (2 wheeled) Gait Pattern/deviations: Decreased step length - left;Decreased stance time - right Gait velocity: decreased   General Gait Details: cues for posture and even strides, staying within walker   Stairs            Wheelchair Mobility    Modified Rankin (Stroke Patients Only)       Balance Overall balance assessment: Needs assistance Sitting-balance support: No upper extremity supported Sitting balance-Leahy Scale: Good      Standing balance support: Single extremity supported Standing balance-Leahy Scale: Poor Standing balance comment: using rw                    Cognition Arousal/Alertness: Awake/alert Behavior During Therapy: WFL for tasks assessed/performed Overall Cognitive Status: Within Functional Limits for tasks assessed                      Exercises Total Joint Exercises Ankle Circles/Pumps: AROM;Both;20 reps Quad Sets: Right;Strengthening;10 reps Towel Squeeze: Strengthening;Both;10 reps Short Arc Quad: Strengthening;Right;10 reps Heel Slides: AAROM;Right;10 reps Hip ABduction/ADduction: Strengthening;Right;10 reps Straight Leg Raises: Strengthening;Right;5 reps Long Arc Quad: Right;Strengthening;5 reps Knee Flexion: AAROM;Right;5 reps Goniometric ROM: 71 degrees flexion    General Comments General comments (skin integrity, edema, etc.): daughter present for session, recommended supervision with all mobility out of bed at this time. Patient and daughter agree.       Pertinent Vitals/Pain Pain Assessment: 0-10 Pain Score: 2  Pain Location: Rt knee Pain Descriptors / Indicators: Sore Pain Intervention(s): Monitored during session    Home Living                      Prior Function            PT Goals (current goals can now be found in the care plan section) Acute Rehab PT Goals [place] Goal Formulation: With patient Time For Goal Achievement: 06/21/15 Potential to Achieve Goals: Good Progress towards PT goals: Progressing toward goals    Frequency  7X/week    PT  Plan Current plan remains appropriate    Co-evaluation             End of Session Equipment Utilized During Treatment: Gait belt;Right knee immobilizer Activity Tolerance: Patient tolerated treatment well Patient left: in chair;with call bell/phone within reach;with family/visitor present;Other (comment)     Time: 1610-9604 PT Time  Calculation (min) (ACUTE ONLY): 38 min  Charges:  $Gait Training: 8-22 mins $Therapeutic Exercise: 23-37 mins                    G Codes:      [name], PT, CSCS Pager [phone] Office [phone]  06/09/2015, 9:02 AM

## 2015-06-09 NOTE — Progress Notes (Signed)
Occupational Therapy Treatment Patient Details Name: Christian [name] MRN: [id number] DOB: February 03, 1929 Today's Date: 06/09/2015    History of present illness Rt TKA   OT comments  Patient progressing nicely towards OT goals, continue plan of care. Pt overall supervision for mobility and continues to need assistance with LB ADLs. Daughter able to provide supervision and assistance post acute d/c. Pt and daughter stated pt had a better night last night.    Follow Up Recommendations  No OT follow up;Supervision/Assistance - 24 hour    Equipment Recommendations  None recommended by OT    Recommendations for Other Services  None at this time   Precautions / Restrictions Precautions Precautions: Knee;Fall Precaution Booklet Issued: Yes (comment) Precaution Comments: reviewed no pillow under knee and zerio knee foam Required Braces or Orthoses: Knee Immobilizer - Right Knee Immobilizer - Right: On except when in CPM Restrictions Weight Bearing Restrictions: Yes RLE Weight Bearing: Weight bearing as tolerated    Mobility Bed Mobility Overal bed mobility: Needs Assistance Bed Mobility: Supine to Sit     Supine to sit: Supervision Sit to supine: Supervision   General bed mobility comments: bed flat no rails  Transfers Overall transfer level: Needs assistance Equipment used: Rolling walker (2 wheeled) Transfers: Sit to/from Stand Sit to Stand: Supervision General transfer comment: supervision for safety, min cueing for hand placement, posture, and technique    Balance Overall balance assessment: Needs assistance Sitting-balance support: No upper extremity supported;Feet supported Sitting balance-Leahy Scale: Good     Standing balance support: Bilateral upper extremity supported;During functional activity Standing balance-Leahy Scale: Fair Standing balance comment: using rw   ADL Overall ADL's : Needs assistance/impaired General ADL Comments: Pt continues to need  assistance with LB ADLs, daughter able to assist. Pt found seated in recliner. Assisted pt to therapy gym and pt performed walk-in shower transfer using BSC, simulated to how he will be doing this at home. Pt then ambualted back to room. Daughter present during session and pt and daughter verbalized/deomonstrated understanding of this. Pt eager to d/c home today.      Cognition   Behavior During Therapy: WFL for tasks assessed/performed Overall Cognitive Status: Within Functional Limits for tasks assessed                 Pertinent Vitals/ Pain       Pain Assessment: 0-10 Pain Score: 6  Pain Location: abdomen (no complaints of pain in left knee) Pain Descriptors / Indicators: Aching;Nagging Pain Intervention(s): Monitored during session;Other (comment) (RN notified of pain)   Frequency Min 2X/week     Progress Toward Goals  OT Goals(current goals can now befound in the care plan section)  Progress towards OT goals: Progressing toward goals  Acute Rehab OT Goals Patient St[place]  Plan Discharge plan remains appropriate    End of Session Equipment Utilized During Treatment: Rolling walker;Right knee immobilizer   Activity Tolerance Patient tolerated treatment well   Patient Left in chair;with call bell/phone within reach;with family/visitor present       Time: 1191-4782 OT Time Calculation (min): 18 min  Charges: OT General Charges $OT Visit: 1 Procedure OT Treatments $Self Care/Home Management : 8-22 mins  Leopoldo Mazzie , MS, OTR/L, CLT Pager: [phone]  06/09/2015, 9:54 AM

## 2015-06-09 NOTE — Care Management Important Message (Signed)
Important Message  Patient Details  Name: Christian [name] MRN: [id number] Date of Birth: [birth year]   Medicare Important Message Given:  Yes-second notification given    Orson Aloe 06/09/2015, 12:00 PM

## 2015-06-30 ENCOUNTER — Ambulatory Visit: Payer: Medicare Other | Attending: Orthopaedic Surgery

## 2015-06-30 DIAGNOSIS — R262 Difficulty in walking, not elsewhere classified: Secondary | ICD-10-CM | POA: Insufficient documentation

## 2015-06-30 DIAGNOSIS — R29898 Other symptoms and signs involving the musculoskeletal system: Secondary | ICD-10-CM | POA: Insufficient documentation

## 2015-06-30 DIAGNOSIS — R6889 Other general symptoms and signs: Secondary | ICD-10-CM

## 2015-06-30 DIAGNOSIS — R609 Edema, unspecified: Secondary | ICD-10-CM

## 2015-06-30 DIAGNOSIS — M25661 Stiffness of right knee, not elsewhere classified: Secondary | ICD-10-CM | POA: Insufficient documentation

## 2015-06-30 NOTE — Therapy (Signed)
[facility] [address] [place], [place], [address] Phone: [phone]   Fax:  [phone]  Physical Therapy Evaluation  Patient Details  Name: Christian [name] MRN: [id number] Date of Birth: [birth year] Referring Provider:  Eldred Manges, MD  Encounter Date: 06/30/2015      PT End of Session - 06/30/15 1226    Visit Number 1   Number of Visits 24   Date for PT Re-Evaluation 09/22/15   Authorization Type Medicare Kx visit 15   Authorization - Visit Number 1   Authorization - Number of Visits 24   PT Start Time 1145   PT Stop Time 1230   PT Time Calculation (min) 45 min   Activity Tolerance Patient tolerated treatment well   Behavior During Therapy [facility] for tasks assessed/performed      Past Medical History  Diagnosis Date  . Diverticulosis   . Colon polyps   . Esophageal stricture   . Spinal stenosis   . Hypertension   . GERD (gastroesophageal reflux disease)   . Arthritis     R knee, L shoulder   . Voice hoarseness     Past Surgical History  Procedure Laterality Date  . Appendectomy    . Knee arthroscopy Left   . Rotator cuff repair Left   . Prostate surgery    . Back surgery      x2  . Knee arthroplasty Right 06/06/2015    Procedure: RIGHT COMPUTER ASSISTED TOTAL KNEE ARTHROPLASTY;  Surgeon: Eldred Manges, MD;  Location: [facility] OR;  Service: Orthopedics;  Laterality: Right;    There were no vitals filed for this visit.  Visit Diagnosis:  Edema - Plan: PT plan of care cert/re-cert  Difficulty walking - Plan: PT plan of care cert/re-cert  Stiffness of knee joint, right - Plan: PT plan of care cert/re-cert  Weakness of right leg - Plan: PT plan of care cert/re-cert  Activity intolerance - Plan: PT plan of care cert/re-cert      Subjective Assessment - 06/30/15 1155    Subjective He reported pain with walking prior to surgery. He received HHPT. He has a HEP  he does 1-3x/day. I asked him to bring his  program in for assessment.    Patient is accompained by: Family member   Pertinent History lt knee surgery, RTC repair LT shoulder. Back surgery x2.    Limitations Sitting;Walking   How long can you sit comfortably? 30 min or more   How long can you stand comfortably? As needed for now   How long can you walk comfortably? walked around block   Patient Stated Goals Return to normal activity. Stairs , yard work,  boat, ride motor bike    Currently in Pain? Yes   Pain Score 5    Pain Location Knee   Pain Orientation Right   Pain Descriptors / Indicators Aching;Shooting   Pain Type Surgical pain   Pain Onset 1 to 4 weeks ago   Pain Frequency Constant   Aggravating Factors  bending, static postures   Pain Relieving Factors medication, cold,    Multiple Pain Sites No            [facility] PT Assessment - 06/30/15 1146    Assessment   Medical Diagnosis RT TKA   Onset Date/Surgical Date 06/06/15   Next MD Visit 07/22/15   Prior Therapy HHPT x 2 weeks last seen 06/23/15   Precautions   Precaution Comments No driving, limit lifting,  use cane walking out of house   Restrictions   Weight Bearing Restrictions No   Balance Screen   Has the patient fallen in the past 6 months No   Has the patient had a decrease in activity level because of a fear of falling?  No   Is the patient reluctant to leave their home because of a fear of falling?  No   Home Environment   Living Environment Private residence   Living Arrangements Children  stay there for now but will return home   Type of Home House   Home Access Stairs to enter   Entrance Stairs-Number of Steps 7   Entrance Stairs-Rails Right;Can reach both   Home Layout Two level   Alternate Level Stairs-Number of Steps 15   Alternate Level Stairs-Rails Right;Left   Additional Comments Bedroom on first floor   Prior Function   Level of Independence Requires assistive device for independence   Cognition   Overall Cognitive Status Within  Functional Limits for tasks assessed   Observation/Other Assessments   Focus on Therapeutic Outcomes (FOTO)  51%   Observation/Other Assessments-Edema    Edema Circumferential   Circumferential Edema   Circumferential - Right 42 cm   Circumferential - Left  36.5 cm   ROM / Strength   AROM / PROM / Strength AROM;Strength   AROM   AROM Assessment Site Knee   Right/Left Knee Right;Left   Right Knee Extension 25   Right Knee Flexion 101   Left Knee Extension 10   Left Knee Flexion 146   PROM   PROM Assessment Site Knee   Right/Left Knee Right   Right Knee Extension 15   Right Knee Flexion 111   Strength   Overall Strength Comments RT hip 4/5   Strength Assessment Site Knee   Right/Left Knee Right;Left   Right Knee Flexion 5/5   Right Knee Extension 5/5   Left Knee Flexion 5/5   Left Knee Extension 5/5   Flexibility   Soft Tissue Assessment /Muscle Length yes   Ambulation/Gait   Assistive device Straight cane   Gait Pattern Step-through pattern   Ambulation Surface Level;Indoor                   [facility] Adult PT Treatment/Exercise - 06/30/15 1146    Exercises   Exercises Knee/Hip   Knee/Hip Exercises: Aerobic   Stationary Bike L1 5 min. Discussed pain levels and stopping. Seat adjusted once to increase flexion    Discussed HEP and he agreed to bring in            PT Education - 06/30/15 1226    Education provided Yes   Education Details POC   Person(s) Educated Patient;Child(ren)   Methods Explanation   Comprehension Verbalized understanding          PT Short Term Goals - 06/30/15 1238    PT SHORT TERM GOAL #1   Title he iwll e independent with intial HEP   Time 4   Period Weeks   Status New   PT SHORT TERM GOAL #2   Title He will improve active flexion to 120 degrees   Time 4   Period Weeks   Status New   PT SHORT TERM GOAL #3   Title He wil improve active RT knee extension to 10 degrees.    Time 4   Period Weeks   Status New   PT  SHORT TERM GOAL #4   Title He  will be able to walk stairs step over step with 2 rail safely.    Time 4   Period Weeks   Status New   PT SHORT TERM GOAL #5   Title He will report pain decreased 30% or more with walking   Time 4   Period Weeks   Status New   Additional Short Term Goals   Additional Short Term Goals Yes   PT SHORT TERM GOAL #6   Title edema will improve 2-3 cm to improve active range of RT knee and improve gait.            PT Long Term Goals - 07-30-15 1240    PT LONG TERM GOAL #1   Title He will report pain decreased 60% or more with normal home tasks   Time 12   Period Weeks   Status New   PT LONG TERM GOAL #2   Title He will return home with wife    Time 12   Period Weeks   Status New   PT LONG TERM GOAL #3   Title He will walk in community without device   Time 12   Period Weeks   Status New   PT LONG TERM GOAL #4   Title He will walk safely with one rail step over step 12-20 steps for safe acess to up stairs and basement of home/.    PT LONG TERM GOAL #5   Title He will return to some basic yard work with min pain   Time 12   Period Weeks   Status New               Plan - 07/30/15 1227    Clinical Impression Statement Mr Hopwood presents post TKA with diffculty walking distances and tolerating static postures, pain with ending knee and decreased range and strength of RT hip . His knee is swollen   Pt will benefit from skilled therapeutic intervention in order to improve on the following deficits Difficulty walking;Pain;Decreased strength;Decreased activity tolerance;Decreased range of motion   Rehab Potential Good   PT Frequency 2x / week   PT Duration 12 weeks   PT Treatment/Interventions Cryotherapy;Vasopneumatic Device;Gait training;Stair training;Therapeutic exercise;Manual techniques;Taping;Patient/family education;Passive range of motion;Balance training   PT Next Visit Plan [facility]view HEP, modalities, manula treatment/tape, bike vs  nustep   Consulted and Agree with Plan of Care Patient          G-Codes - 07-30-15 1358    Functional Assessment Tool Used FOTO 51%   Functional Limitation Mobility: Walking and moving around   Mobility: Walking and Moving Around Current Status [phone]) At least 40 percent but less than 60 percent impaired, limited or restricted   Mobility: Walking and Moving Around Goal Status [phone]) At least 20 percent but less than 40 percent impaired, limited or restricted       Problem List Patient Active Problem List   Diagnosis Date Noted  . Total knee replacement status 06/06/2015    Caprice Red PT 07/30/2015, 2:05 PM  [facility] [address] [place], [place], [address] Phone: [phone]   Fax:  [phone]

## 2015-07-05 ENCOUNTER — Ambulatory Visit: Payer: Medicare Other | Admitting: Physical Therapy

## 2015-07-05 DIAGNOSIS — R262 Difficulty in walking, not elsewhere classified: Secondary | ICD-10-CM

## 2015-07-05 DIAGNOSIS — R609 Edema, unspecified: Secondary | ICD-10-CM | POA: Diagnosis not present

## 2015-07-05 DIAGNOSIS — R6889 Other general symptoms and signs: Secondary | ICD-10-CM

## 2015-07-05 DIAGNOSIS — R29898 Other symptoms and signs involving the musculoskeletal system: Secondary | ICD-10-CM

## 2015-07-05 DIAGNOSIS — M25661 Stiffness of right knee, not elsewhere classified: Secondary | ICD-10-CM

## 2015-07-05 NOTE — Therapy (Signed)
[facility] Outpatient Rehabilitation [facility] [address] [place], [place], [address] Phone: [phone]   Fax:  [phone]  Physical Therapy Treatment  Patient Details  Name: Christian [name] MRN: [id number] Date of Birth: [birth year] Referring Provider:  [name], MD  Encounter Date: 07/05/2015      PT End of Session - 07/05/15 1422    Visit Number 2   Number of Visits 24   Date for PT Re-Evaluation 09/22/15   Authorization Type Medicare Kx visit 15   Authorization - Visit Number 1   Authorization - Number of Visits 24   PT Start Time 1330   PT Stop Time 1415   PT Time Calculation (min) 45 min   Activity Tolerance Patient tolerated treatment well   Behavior During Therapy [facility] for tasks assessed/performed      Past Medical History  Diagnosis Date  . Diverticulosis   . Colon polyps   . Esophageal stricture   . Spinal stenosis   . Hypertension   . GERD (gastroesophageal reflux disease)   . Arthritis     R knee, L shoulder   . Voice hoarseness     Past Surgical History  Procedure Laterality Date  . Appendectomy    . Knee arthroscopy Left   . Rotator cuff repair Left   . Prostate surgery    . Back surgery      x2  . Knee arthroplasty Right 06/06/2015    Procedure: RIGHT COMPUTER ASSISTED TOTAL KNEE ARTHROPLASTY;  Surgeon: Eldred Manges, MD;  Location: [facility] OR;  Service: Orthopedics;  Laterality: Right;    There were no vitals filed for this visit.  Visit Diagnosis:  Edema  Difficulty walking  Stiffness of knee joint, right  Weakness of right leg  Activity intolerance      Subjective Assessment - 07/05/15 1330    Subjective pt reports that he has been doing his HEP, and only has issues with the heel slides and reports doing his heel slides a different way.   Currently in Pain? Yes   Pain Score 6    Pain Location Knee   Pain Orientation Right   Pain Descriptors / Indicators Aching;Tightness   Pain Type Surgical pain   Pain  Onset 1 to 4 weeks ago   Pain Frequency Constant   Aggravating Factors  bending, walking   Pain Relieving Factors medication, cold            [facility] PT Assessment - 07/05/15 0001    AROM   Right Knee Extension -18   Right Knee Flexion 110  115  following manual treatment                     [facility] Adult PT Treatment/Exercise - 07/05/15 1337    Knee/Hip Exercises: Stretches   Passive Hamstring Stretch Right;2 reps;30 seconds   Knee/Hip Exercises: Aerobic   Stationary Bike L1 rocking back and fourth x 2 minutes  was able to get full revolution around for 5 min   Knee/Hip Exercises: Seated   Long Arc Quad AROM;Strengthening;Right;2 sets;10 reps  2#   Knee/Hip Exercises: Supine   Quad Sets AROM;Strengthening;Right;1 set;10 reps  5 sec hold   Short Arc Quad Sets AROM;Right;1 set;10 reps  5 sec hold, 2#   Heel Slides AROM;AAROM;Strengthening;2 sets;10 reps  1 x with strap   Straight Leg Raises AROM;Strengthening;Right;2 sets;10 reps  VC for sustained quad set   Other Supine Knee/Hip Exercises ankle pumps x 20 ea.  Knee/Hip Exercises: Sidelying   Hip ABduction AROM;Strengthening;2 sets;10 reps   Manual Therapy   Manual Therapy Joint mobilization;Passive ROM   Joint Mobilization grade 2-3 knee P<>A monbilizations    Passive ROM passive flexion with towel in popliteal fossa going to end range and oscillation to improve flexion.                 PT Education - 07/05/15 1422    Education provided Yes   Education Details HEP review   Person(s) Educated Patient   Methods Explanation   Comprehension Verbalized understanding          PT Short Term Goals - 06/30/15 1238    PT SHORT TERM GOAL #1   Title he iwll e independent with intial HEP   Time 4   Period Weeks   Status New   PT SHORT TERM GOAL #2   Title He will improve active flexion to 120 degrees   Time 4   Period Weeks   Status New   PT SHORT TERM GOAL #3   Title He wil improve active RT  knee extension to 10 degrees.    Time 4   Period Weeks   Status New   PT SHORT TERM GOAL #4   Title He will be able to walk stairs step over step with 2 rail safely.    Time 4   Period Weeks   Status New   PT SHORT TERM GOAL #5   Title He will report pain decreased 30% or more with walking   Time 4   Period Weeks   Status New   Additional Short Term Goals   Additional Short Term Goals Yes   PT SHORT TERM GOAL #6   Title edema will improve 2-3 cm to improve active range of RT knee and improve gait.            PT Long Term Goals - 06/30/15 1240    PT LONG TERM GOAL #1   Title He will report pain decreased 60% or more with normal home tasks   Time 12   Period Weeks   Status New   PT LONG TERM GOAL #2   Title He will return home with wife    Time 12   Period Weeks   Status New   PT LONG TERM GOAL #3   Title He will walk in community without device   Time 12   Period Weeks   Status New   PT LONG TERM GOAL #4   Title He will walk safely with one rail step over step 12-20 steps for safe acess to up stairs and basement of home/.    PT LONG TERM GOAL #5   Title He will return to some basic yard work with min pain   Time 12   Period Weeks   Status New               Plan - 07/05/15 1422    Clinical Impression Statement Christian [name] reports that he has been consistent with his HEP but has some difficulty with the heel slides. Reviewed the HEP today and gave alternatives to heel slides and sidelying hip abduction. He has made improvements with AROM of -18 to 115 degrees following mobilizations.    PT Next Visit Plan modalities, manual treatment/tape? , bike, CKC strengthening.         Problem List Patient Active Problem List   Diagnosis Date Noted  . Total knee replacement status  06/06/2015   Lulu Riding PT, DPT, LAT, ATC  07/05/2015  2:27 PM      [facility] [address] [place], [place],  [address] Phone: [phone]   Fax:  [phone]

## 2015-07-05 NOTE — Patient Instructions (Signed)
   Kristoffer Leamon PT, DPT, LAT, ATC  [facility] Phone: [phone]     

## 2015-07-06 NOTE — Discharge Summary (Signed)
Patient ID: Christian [name] MRN: [id number] DOB/AGE: December 18, 1928 81 y.o.  Admit date: 06/06/2015 Discharge date: 07/06/2015  Admission Diagnoses:  Active Problems:   Total knee replacement status   Discharge Diagnoses:  Active Problems:   Total knee replacement status  status post Procedure(s): RIGHT COMPUTER ASSISTED TOTAL KNEE ARTHROPLASTY  Past Medical History  Diagnosis Date  . Diverticulosis   . Colon polyps   . Esophageal stricture   . Spinal stenosis   . Hypertension   . GERD (gastroesophageal reflux disease)   . Arthritis     R knee, L shoulder   . Voice hoarseness     Surgeries: Procedure(s): RIGHT COMPUTER ASSISTED TOTAL KNEE ARTHROPLASTY on 06/06/2015   Consultants:    Discharged Condition: Improved  Hospital Course: Christian [name] is an 81 y.o. male who was admitted 06/06/2015 for operative treatment of right knee djd. Patient failed conservative treatments (please see the history and physical for the specifics) and had severe unremitting pain that affects sleep, daily activities and work/hobbies. After pre-op clearance, the patient was taken to the operating room on 06/06/2015 and underwent  Procedure(s): RIGHT COMPUTER ASSISTED TOTAL KNEE ARTHROPLASTY.    Patient was given perioperative antibiotics:  Anti-infectives    Start     Dose/Rate Route Frequency Ordered Stop   06/06/15 1200  ceFAZolin (ANCEF) IVPB 2 g/50 mL premix     2 g 100 mL/hr over 30 Minutes Intravenous To [facility] 06/03/15 1219 06/06/15 1240       Patient was given sequential compression devices and early ambulation to prevent DVT.   Patient benefited maximally from hospital stay and there were no complications. At the time of discharge, the patient was urinating/moving their bowels without difficulty, tolerating a regular diet, pain is controlled with oral pain medications and they have been cleared by PT/OT.   Recent vital signs: No data found.    Recent laboratory  studies: No results for input(s): WBC, HGB, HCT, PLT, NA, K, CL, CO2, BUN, CREATININE, GLUCOSE, INR, CALCIUM in the last 72 hours.  Invalid input(s): PT, 2   Discharge Medications:     Medication List    STOP taking these medications        ALEVE 220 MG tablet  Generic drug:  naproxen sodium     NON FORMULARY     OSTEO BI-FLEX REGULAR STRENGTH PO      TAKE these medications        aspirin 325 MG EC tablet  Take 1 tablet (325 mg total) by mouth daily with breakfast.     CENTRUM SILVER ADULT 50+ PO  Take 1 tablet by mouth daily.     cetirizine 10 MG tablet  Commonly known as:  ZYRTEC  Take 10 mg by mouth daily as needed for allergies.     HYDROcodone-acetaminophen 10-325 MG per tablet  Commonly known as:  NORCO  Take 1-2 tablets by mouth every 6 (six) hours as needed for severe pain.     lisinopril 10 MG tablet  Commonly known as:  PRINIVIL,ZESTRIL  Take 10 mg by mouth daily. Pt. Takes Lisinopril only if BP is 130 systolic or >     omeprazole 20 MG tablet  Commonly known as:  PRILOSEC OTC  Take 20 mg by mouth daily before breakfast.     vitamin B-12 100 MCG tablet  Commonly known as:  CYANOCOBALAMIN  Take 50 mcg by mouth daily.        Diagnostic Studies: No results  found.      Discharge Instructions    Call MD / Call 911    Complete by:  As directed   If you experience chest pain or shortness of breath, CALL 911 and be transported to the hospital emergency room.  If you develope a fever above 101 F, pus (white drainage) or increased drainage or redness at the wound, or calf pain, call your surgeon's office.     Constipation Prevention    Complete by:  As directed   Drink plenty of fluids.  Prune juice may be helpful.  You may use a stool softener, such as Colace (over the counter) 100 mg twice a day.  Use MiraLax (over the counter) for constipation as needed.     Diet - low sodium heart healthy    Complete by:  As directed      Driving restrictions     Complete by:  As directed   No driving     Increase activity slowly as tolerated    Complete by:  As directed      Lifting restrictions    Complete by:  As directed   No lifting           Follow-up Information    Schedule an appointment as soon as possible for a visit with Eldred Manges, MD.   Specialty:  Orthopedic Surgery   Why:  need return office visit 2 weeks postop   Contact information:   [address] [place] [address] [phone]       Follow up with [facility].   Why:  They will contact you to schedule home therapy visits.    Contact information:   [address] [place] [address] [phone]       Schedule an appointment as soon as possible for a visit with Eldred Manges, MD.   Specialty:  Orthopedic Surgery   Why:  need return office visit 2 weeks postop   Contact information:   [address] [place] [address] [phone]       Discharge Plan:  discharge to home  Disposition:     Signed: Naida Sleight  07/06/2015, 8:56 AM

## 2015-07-07 ENCOUNTER — Ambulatory Visit: Payer: Medicare Other

## 2015-07-07 DIAGNOSIS — R609 Edema, unspecified: Secondary | ICD-10-CM | POA: Diagnosis not present

## 2015-07-07 DIAGNOSIS — R29898 Other symptoms and signs involving the musculoskeletal system: Secondary | ICD-10-CM

## 2015-07-07 DIAGNOSIS — M25661 Stiffness of right knee, not elsewhere classified: Secondary | ICD-10-CM

## 2015-07-07 DIAGNOSIS — R262 Difficulty in walking, not elsewhere classified: Secondary | ICD-10-CM

## 2015-07-07 NOTE — Patient Instructions (Signed)
From cabinet gravity assited flexion stretching and extension stretching in supine and sitting and active sitting stretch with heel slides and quad sets 2-3x/day multiple reps or  30 sec stretch as tolerated

## 2015-07-07 NOTE — Therapy (Signed)
[facility] [address] [place], [place], [address] Phone: [phone]   Fax:  [phone]  Physical Therapy Treatment  Patient Details  Name: Christian [name] MRN: [id number] Date of Birth: Jul 20, 1929 Referring Provider:  [name], MD  Encounter Date: 07/07/2015      PT End of Session - 07/07/15 1154    Visit Number 3   Number of Visits 24   Date for PT Re-Evaluation 09/22/15   PT Start Time 1100   PT Stop Time 1145   PT Time Calculation (min) 45 min   Activity Tolerance Patient tolerated treatment well   Behavior During Therapy [facility] for tasks assessed/performed      Past Medical History  Diagnosis Date  . Diverticulosis   . Colon polyps   . Esophageal stricture   . Spinal stenosis   . Hypertension   . GERD (gastroesophageal reflux disease)   . Arthritis     R knee, L shoulder   . Voice hoarseness     Past Surgical History  Procedure Laterality Date  . Appendectomy    . Knee arthroscopy Left   . Rotator cuff repair Left   . Prostate surgery    . Back surgery      x2  . Knee arthroplasty Right 06/06/2015    Procedure: RIGHT COMPUTER ASSISTED TOTAL KNEE ARTHROPLASTY;  Surgeon: Christian Manges, MD;  Location: [facility] OR;  Service: Orthopedics;  Laterality: Right;    There were no vitals filed for this visit.  Visit Diagnosis:  Edema  Difficulty walking  Stiffness of knee joint, right  Weakness of right leg          [facility] PT Assessment - 07/07/15 1114    AROM   Right Knee Extension -18   Right Knee Flexion 111                     [facility] Adult PT Treatment/Exercise - 07/07/15 1113    Knee/Hip Exercises: Stretches   Passive Hamstring Stretch Right;2 reps;30 seconds   Other Knee/Hip Stretches Active and gravity assisted stretching into flexion   Knee/Hip Exercises: Aerobic   Stationary Bike Set for 8 min  at comfortable range    Knee/Hip Exercises: Supine   Short Arc Quad Sets  AROM;Right;1 set;15 reps  10 sec   Manual Therapy   Joint Mobilization grade 2-3 knee P<>A monbilizations and patell amobs , flexion painful extension less sos   Passive ROM passive sttretching flexin and extension     Reviewed all stretch for HEP           PT Education - 07/07/15 1153    Education provided Yes   Education Details HEP stretching   Person(s) Educated Patient   Methods Explanation;Demonstration;Tactile cues;Verbal cues;Handout   Comprehension Returned demonstration;Verbalized understanding          PT Short Term Goals - 06/30/15 1238    PT SHORT TERM GOAL #1   Title he iwll e independent with intial HEP   Time 4   Period Weeks   Status Christian [name]   PT SHORT TERM GOAL #2   Title He will improve active flexion to 120 degrees   Time 4   Period Weeks   Status Christian [name]   PT SHORT TERM GOAL #3   Title He wil improve active RT knee extension to 10 degrees.    Time 4   Period Weeks   Status Christian [name]   PT SHORT TERM GOAL #4  Title He will be able to walk stairs step over step with 2 rail safely.    Time 4   Period Weeks   Status Christian [name]   PT SHORT TERM GOAL #5   Title He will report pain decreased 30% or more with walking   Time 4   Period Weeks   Status Christian [name]   Additional Short Term Goals   Additional Short Term Goals Yes   PT SHORT TERM GOAL #6   Title edema will improve 2-3 cm to improve active range of RT knee and improve gait.            PT Long Term Goals - 06/30/15 1240    PT LONG TERM GOAL #1   Title He will report pain decreased 60% or more with normal home tasks   Time 12   Period Weeks   Status Christian [name]   PT LONG TERM GOAL #2   Title He will return home with wife    Time 12   Period Weeks   Status Christian [name]   PT LONG TERM GOAL #3   Title He will walk in community without device   Time 12   Period Weeks   Status Christian [name]   PT LONG TERM GOAL #4   Title He will walk safely with one rail step over step 12-20 steps for safe acess to up stairs and basement of  home/.    PT LONG TERM GOAL #5   Title He will return to some basic yard work with min pain   Time 12   Period Weeks   Status Christian [name]               Plan - 07/07/15 1154    Clinical Impression Statement His range was still improved and he tolerated treatment well . He will be out of town for a week and we will resume the following week Encouraged him to push range when gone   PT Next Visit Plan modalities, manual treatment/tape? , bike, CKC strengthening.    PT Home Exercise Plan flexion and extension stretching   Consulted and Agree with Plan of Care Patient        Problem List Patient Active Problem List   Diagnosis Date Noted  . Total knee replacement status 06/06/2015    Christian [name] PT 07/07/2015, 12:18 PM  [facility] [address] [place], [place], [address] Phone: [phone]   Fax:  [phone]

## 2015-07-19 ENCOUNTER — Ambulatory Visit: Payer: Medicare Other | Attending: Orthopaedic Surgery

## 2015-07-19 DIAGNOSIS — R609 Edema, unspecified: Secondary | ICD-10-CM | POA: Insufficient documentation

## 2015-07-19 DIAGNOSIS — R262 Difficulty in walking, not elsewhere classified: Secondary | ICD-10-CM | POA: Insufficient documentation

## 2015-07-19 DIAGNOSIS — R6889 Other general symptoms and signs: Secondary | ICD-10-CM | POA: Diagnosis present

## 2015-07-19 DIAGNOSIS — M25661 Stiffness of right knee, not elsewhere classified: Secondary | ICD-10-CM | POA: Diagnosis present

## 2015-07-19 DIAGNOSIS — R600 Localized edema: Secondary | ICD-10-CM | POA: Insufficient documentation

## 2015-07-19 DIAGNOSIS — R29898 Other symptoms and signs involving the musculoskeletal system: Secondary | ICD-10-CM | POA: Diagnosis present

## 2015-07-19 NOTE — Therapy (Signed)
[facility] [address] [place], [place], [address] Phone: [phone]   Fax:  [phone]  Physical Therapy Treatment  Patient Details  Name: Christian [name] MRN: [id number] Date of Birth: Sep 05, 1929 Referring Provider:  [name], MD  Encounter Date: 07/19/2015      PT End of Session - 07/19/15 1501    Visit Number 4   Date for PT Re-Evaluation 09/22/15   PT Start Time 0215   PT Stop Time 0257   PT Time Calculation (min) 42 min   Activity Tolerance Patient tolerated treatment well   Behavior During Therapy [facility] for tasks assessed/performed      Past Medical History  Diagnosis Date  . Diverticulosis   . Colon polyps   . Esophageal stricture   . Spinal stenosis   . Hypertension   . GERD (gastroesophageal reflux disease)   . Arthritis     R knee, L shoulder   . Voice hoarseness     Past Surgical History  Procedure Laterality Date  . Appendectomy    . Knee arthroscopy Left   . Rotator cuff repair Left   . Prostate surgery    . Back surgery      x2  . Knee arthroplasty Right 06/06/2015    Procedure: RIGHT COMPUTER ASSISTED TOTAL KNEE ARTHROPLASTY;  Surgeon: Eldred Manges, MD;  Location: [facility] OR;  Service: Orthopedics;  Laterality: Right;    There were no vitals filed for this visit.  Visit Diagnosis:  Edema, unspecified type  Difficulty walking  Stiffness of knee joint, right  Weakness of right leg      Subjective Assessment - 07/19/15 1418    Subjective No problems on trip . I don't think I can bend my knee any further. Needed to have knee more extended    Currently in Pain? Yes   Pain Score 5    Pain Location Knee   Pain Orientation Right   Pain Descriptors / Indicators Aching;Tightness   Pain Type Surgical pain   Pain Onset More than a month ago   Pain Frequency Constant   Aggravating Factors  bending , walking   Pain Relieving Factors meds , cold   Multiple Pain Sites No            [facility] PT Assessment - 07/19/15 0001    AROM   Right Knee Extension -20   Right Knee Flexion 100   PROM   Right Knee Extension 10   Right Knee Flexion 112                     [facility] Adult PT Treatment/Exercise - 07/19/15 1422    Knee/Hip Exercises: Aerobic   Stationary Bike 5 min for working range then 4 min for coontinuous movement    Knee/Hip Exercises: Supine   Quad Sets AROM;Strengthening;Right;1 set;15 reps   Straight Leg Raises AROM;Strengthening;Right;2 sets;10 reps   Other Supine Knee/Hip Exercises active flexion with stretching   Manual Therapy   Joint Mobilization grade 2-3 knee P<>A monbilizations and patella mobs , flexion painful extension less sos   Passive ROM passive stretching flexion and extension                  PT Short Term Goals - 07/19/15 1503    PT SHORT TERM GOAL #1   Title He will be independent with intial HEP   Status On-going   PT SHORT TERM GOAL #2   Title He will improve  active flexion to 120 degrees   Status On-going   PT SHORT TERM GOAL #3   Title He wil improve active RT knee extension to 10 degrees.    Status On-going   PT SHORT TERM GOAL #4   Title He will be able to walk stairs step over step with 2 rail safely.    Status On-going   PT SHORT TERM GOAL #5   Title He will report pain decreased 30% or more with walking   Status On-going   PT SHORT TERM GOAL #6   Title edema will improve 2-3 cm to improve active range of RT knee and improve gait.    Status On-going           PT Long Term Goals - 06/30/15 1240    PT LONG TERM GOAL #1   Title He will report pain decreased 60% or more with normal home tasks   Time 12   Period Weeks   Status New   PT LONG TERM GOAL #2   Title He will return home with wife    Time 12   Period Weeks   Status New   PT LONG TERM GOAL #3   Title He will walk in community without device   Time 12   Period Weeks   Status New   PT LONG TERM GOAL #4   Title He will walk safely  with one rail step over step 12-20 steps for safe acess to up stairs and basement of home/.    PT LONG TERM GOAL #5   Title He will return to some basic yard work with min pain   Time 12   Period Weeks   Status New               Plan - 07/19/15 1501    Clinical Impression Statement He declined cold at end stating the knee felt ok. Encouraged him to ice if sore when he gtes home. We were able to return to the range as of last visit after mobs and stretching.Marland Kitchen He is walking without device 1/2 time or more.    PT Next Visit Plan modalities, manual treatment/tape? , bike, CKC strengthening.    Measure edema, stairs   Consulted and Agree with Plan of Care Patient        Problem List Patient Active Problem List   Diagnosis Date Noted  . Total knee replacement status 06/06/2015    Caprice Red PT 07/19/2015, 3:05 PM  [facility] [address] [place], [place], [address] Phone: [phone]   Fax:  [phone]

## 2015-07-21 ENCOUNTER — Ambulatory Visit: Payer: Medicare Other | Admitting: Physical Therapy

## 2015-07-21 DIAGNOSIS — M25661 Stiffness of right knee, not elsewhere classified: Secondary | ICD-10-CM

## 2015-07-21 DIAGNOSIS — R609 Edema, unspecified: Secondary | ICD-10-CM | POA: Diagnosis not present

## 2015-07-21 DIAGNOSIS — R6889 Other general symptoms and signs: Secondary | ICD-10-CM

## 2015-07-21 DIAGNOSIS — R29898 Other symptoms and signs involving the musculoskeletal system: Secondary | ICD-10-CM

## 2015-07-21 DIAGNOSIS — R262 Difficulty in walking, not elsewhere classified: Secondary | ICD-10-CM

## 2015-07-21 NOTE — Therapy (Signed)
[facility] [address] [place], [place], [address] Phone: [phone]   Fax:  [phone]  Physical Therapy Treatment  Patient Details  Name: Christian [name] MRN: [id number] Date of Birth: 1928-11-30 Referring Provider:  [name], MD  Encounter Date: 07/21/2015      PT End of Session - 07/21/15 1430    Visit Number 5   Number of Visits 24   Date for PT Re-Evaluation 09/22/15   Authorization Type Medicare Kx visit 15   PT Start Time 0210   PT Stop Time 0258   PT Time Calculation (min) 48 min      Past Medical History  Diagnosis Date  . Diverticulosis   . Colon polyps   . Esophageal stricture   . Spinal stenosis   . Hypertension   . GERD (gastroesophageal reflux disease)   . Arthritis     R knee, L shoulder   . Voice hoarseness     Past Surgical History  Procedure Laterality Date  . Appendectomy    . Knee arthroscopy Left   . Rotator cuff repair Left   . Prostate surgery    . Back surgery      x2  . Knee arthroplasty Right 06/06/2015    Procedure: RIGHT COMPUTER ASSISTED TOTAL KNEE ARTHROPLASTY;  Surgeon: Eldred Manges, MD;  Location: [facility] OR;  Service: Orthopedics;  Laterality: Right;    There were no vitals filed for this visit.  Visit Diagnosis:  Edema, unspecified type  Difficulty walking  Stiffness of knee joint, right  Weakness of right leg  Activity intolerance      Subjective Assessment - 07/21/15 1506    Subjective 15 minutes on my bike at home before I came. No pain. I still walk the steps one at a time.    Currently in Pain? No/denies            [facility] PT Assessment - 07/21/15 0001    AROM   Right Knee Extension -10   Right Knee Flexion 102  improved t0 110 after manual   PROM   Right Knee Flexion 112                     [facility] Adult PT Treatment/Exercise - 07/21/15 0001    Ambulation/Gait   Stairs Yes   Stair Management Technique One rail Right;Two  rails;Alternating pattern   Number of Stairs 12   Height of Stairs 6   Knee/Hip Exercises: Stretches   Knee: Self-Stretch to increase Flexion 3 reps;30 seconds   Knee: Self-Stretch Limitations heel slide with strap   Knee/Hip Exercises: Aerobic   Nustep Level 4 LE x 5 min   Knee/Hip Exercises: Standing   Lateral Step Up 10 reps;Hand Hold: 1;Step Height: 4"   Forward Step Up 10 reps;20 reps;Hand Hold: 1;Hand Hold: 0;Step Height: 4";Step Height: 6"   Step Down 10 reps;Hand Hold: 1;Step Height: 4"   Wall Squat 10 reps   Knee/Hip Exercises: Seated   Hamstring Curl Right   Hamstring Limitations red band x  10 HEP   Manual Therapy   Manual Therapy Soft tissue mobilization   Soft tissue mobilization Rock blade used to soften and lengthen right quadriceps, AROM improved to 110 after soft tissue work                PT Education - 07/21/15 1513    Education provided Yes   Education Details Red band hamstring curl, practive steps with  reciprocal pattern and step ups using hand rail   Person(s) Educated Patient   Methods Explanation;Handout   Comprehension Verbalized understanding          PT Short Term Goals - 07/21/15 1521    PT SHORT TERM GOAL #1   Title He will be independent with intial HEP   Time 4   Period Weeks   Status Achieved   PT SHORT TERM GOAL #2   Title He will improve active flexion to 120 degrees   Time 4   Period Weeks   Status On-going   PT SHORT TERM GOAL #3   Title He wil improve active RT knee extension to 10 degrees.    Time 4   Period Weeks   Status Achieved   PT SHORT TERM GOAL #4   Title He will be able to walk stairs step over step with 2 rail safely.    Time 4   Period Weeks   Status Achieved   PT SHORT TERM GOAL #5   Title He will report pain decreased 30% or more with walking   Time 4   Period Weeks   Status Unable to assess   PT SHORT TERM GOAL #6   Title edema will improve 2-3 cm to improve active range of RT knee and improve  gait.    Time 4   Period Weeks   Status Unable to assess           PT Long Term Goals - 06/30/15 1240    PT LONG TERM GOAL #1   Title He will report pain decreased 60% or more with normal home tasks   Time 12   Period Weeks   Status New   PT LONG TERM GOAL #2   Title He will return home with wife    Time 12   Period Weeks   Status New   PT LONG TERM GOAL #3   Title He will walk in community without device   Time 12   Period Weeks   Status New   PT LONG TERM GOAL #4   Title He will walk safely with one rail step over step 12-20 steps for safe acess to up stairs and basement of home/.    PT LONG TERM GOAL #5   Title He will return to some basic yard work with min pain   Time 12   Period Weeks   Status New               Plan - 07/21/15 1518    Clinical Impression Statement Pt reports he uses step to pattern with stairs. Worked on step ups for knee strengthening and reciprocal stairs in clinic. He did well ascending reciprocal stairs however had some pain with descending. Soft tissue work to [employer] as well as AAROM heel slides with good recovery of ROM at end of treatment. Declines ice. No increased pain.    PT Next Visit Plan CHECK GOALS- see what MD said, manual , bike, CKC strengthening.    Measure edema, continue stairs/step ups        Problem List Patient Active Problem List   Diagnosis Date Noted  . Total knee replacement status 06/06/2015    Sherrie Mustache, PTA 07/21/2015, 3:23 PM  [facility] [address] [place], [place], [address] Phone: [phone]   Fax:  [phone]

## 2015-07-21 NOTE — Patient Instructions (Signed)
  http://orth.exer.us/690   CKnee Flexion: Resisted (Sitting)   Sit with band under left foot and looped around ankle of supported leg. Pull unsupported leg back. Repeat __10__ times per set. Do __2__ sets per session. Do _2___ sessions per day.  http://orth.exer.us/695   Copyright  VHI. All rights reserved.

## 2015-07-26 ENCOUNTER — Ambulatory Visit: Payer: Medicare Other

## 2015-07-26 DIAGNOSIS — M25661 Stiffness of right knee, not elsewhere classified: Secondary | ICD-10-CM

## 2015-07-26 DIAGNOSIS — R29898 Other symptoms and signs involving the musculoskeletal system: Secondary | ICD-10-CM

## 2015-07-26 DIAGNOSIS — R609 Edema, unspecified: Secondary | ICD-10-CM | POA: Diagnosis not present

## 2015-07-26 DIAGNOSIS — R262 Difficulty in walking, not elsewhere classified: Secondary | ICD-10-CM

## 2015-07-26 NOTE — Patient Instructions (Signed)
We discussed exercises at home and I tried to assure him he was doing all well and only to stop if incr, pain, swelling ,temp or not able to walk after exercise

## 2015-07-26 NOTE — Therapy (Signed)
[facility] [address] [place], [place], [address] Phone: [phone]   Fax:  [phone]  Physical Therapy Treatment  Patient Details  Name: Christian [name] MRN: [id number] Date of Birth: 04-May-1929 Referring Provider:  [name], MD  Encounter Date: 07/26/2015      PT End of Session - 07/26/15 1414    Visit Number 6   Number of Visits 24   Date for PT Re-Evaluation 09/22/15   PT Start Time 0125   PT Stop Time 0213   PT Time Calculation (min) 48 min   Activity Tolerance Patient tolerated treatment well   Behavior During Therapy [facility] for tasks assessed/performed      Past Medical History  Diagnosis Date  . Diverticulosis   . Colon polyps   . Esophageal stricture   . Spinal stenosis   . Hypertension   . GERD (gastroesophageal reflux disease)   . Arthritis     R knee, L shoulder   . Voice hoarseness     Past Surgical History  Procedure Laterality Date  . Appendectomy    . Knee arthroscopy Left   . Rotator cuff repair Left   . Prostate surgery    . Back surgery      x2  . Knee arthroplasty Right 06/06/2015    Procedure: RIGHT COMPUTER ASSISTED TOTAL KNEE ARTHROPLASTY;  Surgeon: Eldred Manges, MD;  Location: [facility] OR;  Service: Orthopedics;  Laterality: Right;    There were no vitals filed for this visit.  Visit Diagnosis:  Difficulty walking  Stiffness of knee joint, right  Weakness of right leg      Subjective Assessment - 07/26/15 1326    Subjective Prone lying with 5 pound weight  has really helped. i do it 20-30 min past 3 days. MD pleased with progress   Currently in Pain? Yes            [facility] PT Assessment - 07/26/15 0001    AROM   Right Knee Extension -12   Right Knee Flexion 110   PROM   Right Knee Extension -10   Right Knee Flexion 115   125 at end of session with heavy overpressure                     [facility] Adult PT Treatment/Exercise - 07/26/15 1323     Ambulation/Gait   Stairs Yes   Stair Management Technique One rail Right;Alternating pattern   Number of Stairs 20   Height of Stairs 6   Knee/Hip Exercises: Aerobic   Stationary Bike 5 min for working range then 4 min for coontinuous movement    Knee/Hip Exercises: Standing   Heel Raises Both;20 reps   Hip ADduction Right;Left;Strengthening;15 reps   Hip ADduction Limitations green band   Hip Abduction Stengthening;Right;Left;15 reps   Abduction Limitations green band   Hip Extension Right;Left;15 reps   Extension Limitations green band   Lateral Step Up Step Height: 8";15 reps;Right   Knee/Hip Exercises: Supine   Short Arc Quad Sets Strengthening;Right;15 reps   Short Arc Quad Sets Limitations 10 seconds   Other Supine Knee/Hip Exercises active flexion with gravity stretching x 15 reps then gemtle sustained pressure for flexion   Knee/Hip Exercises: Prone   Hamstring Curl 1 set;20 reps   Hamstring Curl Limitations 4 pounds   Manual Therapy   Passive ROM passive stretching flexion and extension  PT Short Term Goals - 07/21/15 1521    PT SHORT TERM GOAL #1   Title He will be independent with intial HEP   Time 4   Period Weeks   Status Achieved   PT SHORT TERM GOAL #2   Title He will improve active flexion to 120 degrees   Time 4   Period Weeks   Status On-going   PT SHORT TERM GOAL #3   Title He wil improve active RT knee extension to 10 degrees.    Time 4   Period Weeks   Status Achieved   PT SHORT TERM GOAL #4   Title He will be able to walk stairs step over step with 2 rail safely.    Time 4   Period Weeks   Status Achieved   PT SHORT TERM GOAL #5   Title He will report pain decreased 30% or more with walking   Time 4   Period Weeks   Status Unable to assess   PT SHORT TERM GOAL #6   Title edema will improve 2-3 cm to improve active range of RT knee and improve gait.    Time 4   Period Weeks   Status Unable to assess            PT Long Term Goals - 07/26/15 1417    PT LONG TERM GOAL #1   Title He will report pain decreased 60% or more with normal home tasks   Status On-going   PT LONG TERM GOAL #2   Title He will return home with wife    Status On-going   PT LONG TERM GOAL #3   Title He will walk in community without device   Status Achieved   PT LONG TERM GOAL #4   Title He will walk safely with one rail step over step 12-20 steps for safe acess to up stairs and basement of home/.    Status Achieved               Plan - 07/26/15 1415    Clinical Impression Statement Improved flexion rangre and walking without device 90-100% of time, improved independence with walking.    PT Next Visit Plan Continue range and closed chain strength and add to HEP   Consulted and Agree with Plan of Care Patient        Problem List Patient Active Problem List   Diagnosis Date Noted  . Total knee replacement status 06/06/2015    Caprice Red PT 07/26/2015, 2:56 PM  [facility] [address] [place], [place], [address] Phone: [phone]   Fax:  [phone]

## 2015-07-28 ENCOUNTER — Ambulatory Visit: Payer: Medicare Other | Admitting: Physical Therapy

## 2015-07-28 DIAGNOSIS — R609 Edema, unspecified: Secondary | ICD-10-CM | POA: Diagnosis not present

## 2015-07-28 DIAGNOSIS — R6889 Other general symptoms and signs: Secondary | ICD-10-CM

## 2015-07-28 DIAGNOSIS — M25661 Stiffness of right knee, not elsewhere classified: Secondary | ICD-10-CM

## 2015-07-28 DIAGNOSIS — R29898 Other symptoms and signs involving the musculoskeletal system: Secondary | ICD-10-CM

## 2015-07-28 DIAGNOSIS — R262 Difficulty in walking, not elsewhere classified: Secondary | ICD-10-CM

## 2015-07-28 NOTE — Therapy (Signed)
[facility] [address] [place], [place], [address] Phone: [phone]   Fax:  [phone]  Physical Therapy Treatment  Patient Details  Name: Christian [name] MRN: [id number] Date of Birth: [birth year] Referring Provider:  [name], MD  Encounter Date: 07/28/2015      PT End of Session - 07/28/15 1150    Visit Number 7   Number of Visits 24   Date for PT Re-Evaluation 09/22/15   Authorization Type Medicare Kx visit 15   PT Start Time 1100   PT Stop Time 1145   PT Time Calculation (min) 45 min   Activity Tolerance Patient tolerated treatment well   Behavior During Therapy [facility] for tasks assessed/performed      Past Medical History  Diagnosis Date  . Diverticulosis   . Colon polyps   . Esophageal stricture   . Spinal stenosis   . Hypertension   . GERD (gastroesophageal reflux disease)   . Arthritis     R knee, L shoulder   . Voice hoarseness     Past Surgical History  Procedure Laterality Date  . Appendectomy    . Knee arthroscopy Left   . Rotator cuff repair Left   . Prostate surgery    . Back surgery      x2  . Knee arthroplasty Right 06/06/2015    Procedure: RIGHT COMPUTER ASSISTED TOTAL KNEE ARTHROPLASTY;  Surgeon: Eldred Manges, MD;  Location: [facility] OR;  Service: Orthopedics;  Laterality: Right;    There were no vitals filed for this visit.  Visit Diagnosis:  Difficulty walking  Stiffness of knee joint, right  Weakness of right leg  Edema, unspecified type  Activity intolerance      Subjective Assessment - 07/28/15 1102    Subjective Wasn't able to do many exercises yesterday due to busy day.     Currently in Pain? Yes   Pain Score --  no pain at rest; reports occasional "shooting" pain; did not rate   Pain Location Knee   Pain Orientation Right   Pain Descriptors / Indicators Shooting            [facility] PT Assessment - 07/28/15 1134    AROM   Right Knee Extension -7   PROM   Right Knee Extension -3   Right Knee Flexion 120                     [facility] Adult PT Treatment/Exercise - 07/28/15 1104    Ambulation/Gait   Stairs Yes   Stair Management Technique No rails;Alternating pattern  hand over rail for safety; did not use   Number of Stairs 8   Height of Stairs 6   Gait Comments min cues with stairs for technique and control   Knee/Hip Exercises: Aerobic   Stationary Bike 5 min continuous; then 5 min partial revolutions for ROM   Knee/Hip Exercises: Standing   Forward Step Up Right;10 reps;Hand Hold: 0;Step Height: 6"   Knee/Hip Exercises: Supine   Short Arc Quad Sets Strengthening;Right;15 reps   Short Arc Quad Sets Limitations 10 sec hold; 2#   Straight Leg Raises Right;15 reps   Straight Leg Raises Limitations 2#   Straight Leg Raise with External Rotation Right;15 reps   Straight Leg Raise with External Rotation Limitations min cues for VMO activation   Manual Therapy   Manual Therapy Joint mobilization;Passive ROM   Joint Mobilization grade 2-3 knee P<>A mobilizations and patella mobs  Passive ROM passive stretching flexion and extension                  PT Short Term Goals - 07/21/15 1521    PT SHORT TERM GOAL #1   Title He will be independent with intial HEP   Time 4   Period Weeks   Status Achieved   PT SHORT TERM GOAL #2   Title He will improve active flexion to 120 degrees   Time 4   Period Weeks   Status On-going   PT SHORT TERM GOAL #3   Title He wil improve active RT knee extension to 10 degrees.    Time 4   Period Weeks   Status Achieved   PT SHORT TERM GOAL #4   Title He will be able to walk stairs step over step with 2 rail safely.    Time 4   Period Weeks   Status Achieved   PT SHORT TERM GOAL #5   Title He will report pain decreased 30% or more with walking   Time 4   Period Weeks   Status Unable to assess   PT SHORT TERM GOAL #6   Title edema will improve 2-3 cm to improve active range of RT  knee and improve gait.    Time 4   Period Weeks   Status Unable to assess           PT Long Term Goals - 07/26/15 1417    PT LONG TERM GOAL #1   Title He will report pain decreased 60% or more with normal home tasks   Status On-going   PT LONG TERM GOAL #2   Title He will return home with wife    Status On-going   PT LONG TERM GOAL #3   Title He will walk in community without device   Status Achieved   PT LONG TERM GOAL #4   Title He will walk safely with one rail step over step 12-20 steps for safe acess to up stairs and basement of home/.    Status Achieved               Plan - 07/28/15 1151    Clinical Impression Statement Pt with improved extension and able to get to 3 degrees from 0 passively.  Demonstrates poor VMO activation and will continue to benefit from PT to improve ROM and functional mobility.   PT Next Visit Plan Continue range and closed chain strength and add to HEP   PT Home Exercise Plan flexion and extension stretching   Consulted and Agree with Plan of Care Patient        Problem List Patient Active Problem List   Diagnosis Date Noted  . Total knee replacement status 06/06/2015   Clarita CraneStephanie F Arilyn Brierley, PT, DPT 07/28/2015 11:53 AM  [facility] [address] [place], [place], [address] Phone: [phone]   Fax:  [phone]

## 2015-08-02 ENCOUNTER — Ambulatory Visit: Payer: Medicare Other

## 2015-08-02 DIAGNOSIS — R262 Difficulty in walking, not elsewhere classified: Secondary | ICD-10-CM

## 2015-08-02 DIAGNOSIS — R609 Edema, unspecified: Secondary | ICD-10-CM | POA: Diagnosis not present

## 2015-08-02 DIAGNOSIS — M25661 Stiffness of right knee, not elsewhere classified: Secondary | ICD-10-CM

## 2015-08-02 DIAGNOSIS — R29898 Other symptoms and signs involving the musculoskeletal system: Secondary | ICD-10-CM

## 2015-08-02 NOTE — Therapy (Signed)
[facility] [address] [place], [place], [address] Phone: [phone]   Fax:  [phone]  Physical Therapy Treatment  Patient Details  Name: Christian [name] MRN: [id number] Date of Birth: Feb 28, 1929 No Data Recorded  Encounter Date: 08/02/2015      PT End of Session - 08/02/15 1817    Visit Number 8   Number of Visits 24   Date for PT Re-Evaluation 09/22/15   PT Start Time 0130   PT Stop Time 0215   PT Time Calculation (min) 45 min   Activity Tolerance Patient tolerated treatment well   Behavior During Therapy [facility] for tasks assessed/performed      Past Medical History  Diagnosis Date  . Diverticulosis   . Colon polyps   . Esophageal stricture   . Spinal stenosis   . Hypertension   . GERD (gastroesophageal reflux disease)   . Arthritis     R knee, L shoulder   . Voice hoarseness     Past Surgical History  Procedure Laterality Date  . Appendectomy    . Knee arthroscopy Left   . Rotator cuff repair Left   . Prostate surgery    . Back surgery      x2  . Knee arthroplasty Right 06/06/2015    Procedure: RIGHT COMPUTER ASSISTED TOTAL KNEE ARTHROPLASTY;  Surgeon: Eldred MangesMark C Yates, MD;  Location: [facility] OR;  Service: Orthopedics;  Laterality: Right;    There were no vitals filed for this visit.  Visit Diagnosis:  Difficulty walking  Stiffness of knee joint, right  Weakness of right leg  Edema, unspecified type      Subjective Assessment - 08/02/15 1335    Subjective I was busy  yesterday so did not do HEP until later in day. So I am sore today   Currently in Pain? Yes   Pain Score --  did not rate   Pain Location Knee   Pain Orientation Right   Pain Descriptors / Indicators Sore   Pain Type Surgical pain   Pain Onset More than a month ago   Pain Frequency Constant   Aggravating Factors  stretching, walking   Pain Relieving Factors meds , cold   Multiple Pain Sites No            [facility] PT  Assessment - 08/02/15 1337    Circumferential Edema   Circumferential - Right 40 cm                     [facility] Adult PT Treatment/Exercise - 08/02/15 1337    Ambulation/Gait   Gait velocity .26 feet /sec   Knee/Hip Exercises: Aerobic   Nustep L7 6 min   Knee/Hip Exercises: Standing   Lateral Step Up Right;Step Height: 8";20 reps   Step Down Right;10 reps;Step Height: 4"   Step Down Limitations Then 6 inch x 15 with emphasis on stepping back ward up step/    Knee/Hip Exercises: Seated   Long Arc Quad Right;15 reps   Long Arc Quad Weight 8 lbs.   Long Arc Quad Limitations 5-8 sec hold    Flexion stretching with ROM mesurements Extension stretching and active QS with heel slide with heel on floor.              PT Short Term Goals - 08/02/15 1359    PT SHORT TERM GOAL #6   Title edema will improve 2-3 cm to improve active range of RT knee and improve gait.  Baseline 2 cm improved to 40 cm   Status Achieved           PT Long Term Goals - 07/26/15 1417    PT LONG TERM GOAL #1   Title He will report pain decreased 60% or more with normal home tasks   Status On-going   PT LONG TERM GOAL #2   Title He will return home with wife    Status On-going   PT LONG TERM GOAL #3   Title He will walk in community without device   Status Achieved   PT LONG TERM GOAL #4   Title He will walk safely with one rail step over step 12-20 steps for safe acess to up stairs and basement of home/.    Status Achieved               Plan - 08/02/15 1817    Clinical Impression Statement [name] continues with functional improvement and swelling measurably improved. His active flexion is improved. Extension does not appear to be as good as last visit.    PT Next Visit Plan Continue range and closed chain strength and add to HEP. continue to push ROM as tolerated   Consulted and Agree with Plan of Care Patient        Problem List Patient Active Problem List    Diagnosis Date Noted  . Total knee replacement status 06/06/2015    Caprice Red PT 08/02/2015, 6:19 PM  [facility] [address] [place], [place], [address] Phone: [phone]   Fax:  [phone]  Name: Christian [name] MRN: [id number] Date of Birth: Aug 14, 1929

## 2015-08-04 ENCOUNTER — Ambulatory Visit: Payer: Medicare Other | Admitting: Physical Therapy

## 2015-08-04 DIAGNOSIS — R6889 Other general symptoms and signs: Secondary | ICD-10-CM

## 2015-08-04 DIAGNOSIS — R609 Edema, unspecified: Secondary | ICD-10-CM | POA: Diagnosis not present

## 2015-08-04 DIAGNOSIS — R29898 Other symptoms and signs involving the musculoskeletal system: Secondary | ICD-10-CM

## 2015-08-04 DIAGNOSIS — M25661 Stiffness of right knee, not elsewhere classified: Secondary | ICD-10-CM

## 2015-08-04 DIAGNOSIS — R600 Localized edema: Secondary | ICD-10-CM

## 2015-08-04 DIAGNOSIS — R262 Difficulty in walking, not elsewhere classified: Secondary | ICD-10-CM

## 2015-08-04 NOTE — Therapy (Signed)
[facility] [address] [place], [place], [address] Phone: [phone]   Fax:  [phone]  Physical Therapy Treatment  Patient Details  Name: Christian MarvelCharles E Wolaver MRN: [id number] Date of Birth: July 27, 1929 No Data Recorded  Encounter Date: 08/04/2015      PT End of Session - 08/04/15 1654    Visit Number 9   Number of Visits 24   Date for PT Re-Evaluation 09/22/15   PT Start Time 1330   PT Stop Time 1415   PT Time Calculation (min) 45 min   Activity Tolerance Patient tolerated treatment well;No increased pain   Behavior During Therapy [facility] for tasks assessed/performed      Past Medical History  Diagnosis Date  . Diverticulosis   . Colon polyps   . Esophageal stricture   . Spinal stenosis   . Hypertension   . GERD (gastroesophageal reflux disease)   . Arthritis     R knee, L shoulder   . Voice hoarseness     Past Surgical History  Procedure Laterality Date  . Appendectomy    . Knee arthroscopy Left   . Rotator cuff repair Left   . Prostate surgery    . Back surgery      x2  . Knee arthroplasty Right 06/06/2015    Procedure: RIGHT COMPUTER ASSISTED TOTAL KNEE ARTHROPLASTY;  Surgeon: Eldred MangesMark C Yates, MD;  Location: [facility] OR;  Service: Orthopedics;  Laterality: Right;    There were no vitals filed for this visit.  Visit Diagnosis:  Difficulty walking  Stiffness of knee joint, right  Weakness of right leg  Edema, unspecified type  Activity intolerance  Localized edema      Subjective Assessment - 08/04/15 1328    Subjective sore today.  Distal patella 5/10.  Now easier to stand up and place leg int pants.   Currently in Pain? Yes   Pain Score 5    Pain Location Knee   Pain Orientation Right   Pain Descriptors / Indicators Sore   Aggravating Factors  extra time in yard   Pain Relieving Factors cold   Multiple Pain Sites No                         [facility] Adult PT Treatment/Exercise -  08/04/15 1336    Ambulation/Gait   Number of Stairs 12   Height of Stairs 6  step over step   Knee/Hip Exercises: Stretches   Gastroc Stretch 3 reps;30 seconds   Knee/Hip Exercises: Aerobic   Nustep L6, 7 minutes   Knee/Hip Exercises: Standing   Heel Raises --  single 10, 2 hands   Lateral Step Up Right;Step Height: 8";20 reps   Step Down Right;2 sets;10 reps;Hand Hold: 1;Step Height: 4"   SLS 30+ seconds   Knee/Hip Exercises: Supine   Quad Sets --  10   Heel Slides 10 reps   Heel Slides Limitations 120 AROM   Knee/Hip Exercises: Sidelying   Clams 10    Knee/Hip Exercises: Prone   Hamstring Curl 5 reps   Manual Therapy   Manual Therapy Joint mobilization;Passive ROM   Joint Mobilization grade 2 self mobilization with towel for flexion.     Passive ROM passive extension and flexion.                  PT Short Term Goals - 08/04/15 1657    PT SHORT TERM GOAL #1   Title He will be  independent with intial HEP   Status Achieved   PT SHORT TERM GOAL #2   Title He will improve active flexion to 120 degrees   Baseline 120   Time 4   Period Weeks   Status Achieved   PT SHORT TERM GOAL #3   Title He wil improve active RT knee extension to 10 degrees.    Time 4   Period Weeks   Status Achieved   PT SHORT TERM GOAL #4   Title He will be able to walk stairs step over step with 2 rail safely.    Status Achieved   PT SHORT TERM GOAL #5   Title He will report pain decreased 30% or more with walking   Time 4   Period Weeks   Status Unable to assess   PT SHORT TERM GOAL #6   Title edema will improve 2-3 cm to improve active range of RT knee and improve gait.    Status Achieved           PT Long Term Goals - 08/04/15 1657    PT LONG TERM GOAL #1   Title He will report pain decreased 60% or more with normal home tasks   Time 12   Period Weeks   Status On-going   PT LONG TERM GOAL #2   Title He will return home with wife    Time 12   Period Weeks   Status  Unable to assess   PT LONG TERM GOAL #3   Title He will walk in community without device   Time 12   Period Weeks   Status Achieved   PT LONG TERM GOAL #4   Title He will walk safely with one rail step over step 12-20 steps for safe acess to up stairs and basement of home/.    Status Achieved   PT LONG TERM GOAL #5   Title He will return to some basic yard work with min pain   Baseline yard work 4-5/10 pain yesterday   Time 12   Period Weeks   Status On-going               Plan - 08/04/15 1655    Clinical Impression Statement FOTO next visit.  120 degrees AROM Flexion,  some extension may be from Hip flexion.  Extension improves with manual and exercise.     PT Next Visit Plan Continue range and closed chain strength and add to HEP. continue to push ROM as tolerated.  FOTO 10th visit, MD note if not already done last visit.   PT Home Exercise Plan continue , nothing new added   Consulted and Agree with Plan of Care Patient        Problem List Patient Active Problem List   Diagnosis Date Noted  . Total knee replacement status 06/06/2015    [facility] 08/04/2015, 5:00 PM  [facility] [address] [place], [place], [address] Phone: [phone]   Fax:  [phone]  Name: Christian [name] MRN: [id number] Date of Birth: [birth year]    Liz Beach, PTA 08/04/2015 5:00 PM Phone: [phone] Fax: [phone]

## 2015-08-11 ENCOUNTER — Encounter: Payer: Medicare Other | Admitting: Physical Therapy

## 2015-08-16 ENCOUNTER — Ambulatory Visit: Payer: Medicare Other | Attending: Orthopaedic Surgery | Admitting: Physical Therapy

## 2015-08-16 DIAGNOSIS — R29898 Other symptoms and signs involving the musculoskeletal system: Secondary | ICD-10-CM | POA: Insufficient documentation

## 2015-08-16 DIAGNOSIS — R609 Edema, unspecified: Secondary | ICD-10-CM | POA: Insufficient documentation

## 2015-08-16 DIAGNOSIS — R262 Difficulty in walking, not elsewhere classified: Secondary | ICD-10-CM | POA: Diagnosis present

## 2015-08-16 DIAGNOSIS — R6889 Other general symptoms and signs: Secondary | ICD-10-CM | POA: Diagnosis present

## 2015-08-16 DIAGNOSIS — M25661 Stiffness of right knee, not elsewhere classified: Secondary | ICD-10-CM | POA: Diagnosis present

## 2015-08-16 NOTE — Therapy (Signed)
[facility] [address] [place], [place], [address] Phone: [phone]   Fax:  [phone]  Physical Therapy Treatment  Patient Details  Name: Christian [name] MRN: [id number] Date of Birth: Apr 12, 1929 No Data Recorded  Encounter Date: 08/16/2015      PT End of Session - 08/16/15 1759    Visit Number 10   Number of Visits 24   Date for PT Re-Evaluation 09/22/15   PT Start Time 1330   PT Stop Time 1420   PT Time Calculation (min) 50 min   Activity Tolerance Patient tolerated treatment well;No increased pain   Behavior During Therapy [facility] for tasks assessed/performed      Past Medical History  Diagnosis Date  . Diverticulosis   . Colon polyps   . Esophageal stricture   . Spinal stenosis   . Hypertension   . GERD (gastroesophageal reflux disease)   . Arthritis     R knee, L shoulder   . Voice hoarseness     Past Surgical History  Procedure Laterality Date  . Appendectomy    . Knee arthroscopy Left   . Rotator cuff repair Left   . Prostate surgery    . Back surgery      x2  . Knee arthroplasty Right 06/06/2015    Procedure: RIGHT COMPUTER ASSISTED TOTAL KNEE ARTHROPLASTY;  Surgeon: Eldred MangesMark C Yates, MD;  [facility];  Service: Orthopedics;  Laterality: Right;    There were no vitals filed for this visit.  Visit Diagnosis:  Stiffness of knee joint, right  Weakness of right leg  Activity intolerance      Subjective Assessment - 08/16/15 1335    Subjective Wants to graduate from PT after he see's his MD next week.  LT hip hurts more than knee replacement .  [place] to [place] last [place].     Currently in Pain? No/denies   Pain Location Knee   Pain Orientation Right   Pain Descriptors / Indicators Sore   Pain Frequency Rarely   Aggravating Factors  over exertion    Pain Relieving Factors cold   Multiple Pain Sites --  sharp hip LT carrying objects when he takes a sharp turn.                            [facility] Adult PT Treatment/Exercise - 08/16/15 1339    Knee/Hip Exercises: Stretches   Passive Hamstring Stretch 2 reps;30 seconds   Quad Stretch Limitations over edge of the bed with patewllar glides   Hip Flexor Stretch 3 reps  RT   Knee/Hip Exercises: Aerobic   Recumbent Bike 6 minutes L3   Knee/Hip Exercises: Standing   Forward Step Up Limitations 8 inches 10 X   Step Down Limitations 6 inches 10 X    Wall Squat Limitations 10 X   Knee/Hip Exercises: Seated   Heel Slides 10 reps   Knee/Hip Exercises: Supine   Quad Sets 10 reps  5 seconds   Patellar Mobs yes   Knee Extension Limitations multiple reps with stretch.   Moist Heat Therapy   Number Minutes Moist Heat 15 Minutes   Moist Heat Location Knee  hamstring   Manual Therapy   Manual therapy comments 1.5 finger's width from mat post session.    Joint Mobilization patellar mobilization,  passive anterior hip stretch.   Passive ROM passive extension  PT Education - 08/16/15 1758    Education provided Yes   Education Details avoid bouncing during hamstring stretch    Person(s) Educated Patient   Methods Explanation   Comprehension Verbalized understanding          PT Short Term Goals - 08/16/15 1802    PT SHORT TERM GOAL #1   Title He will be independent with intial HEP   Status Achieved   PT SHORT TERM GOAL #2   Title He will improve active flexion to 120 degrees   Time 4   Period Weeks   Status Achieved   PT SHORT TERM GOAL #3   Title He wil improve active RT knee extension to 10 degrees.    Time 4   Period Weeks   Status Achieved   PT SHORT TERM GOAL #4   Title He will be able to walk stairs step over step with 2 rail safely.    Time 4   Period Weeks   Status Achieved   PT SHORT TERM GOAL #5   Title He will report pain decreased 30% or more with walking   Baseline knee does not bother him anymore   Time 4   Period Weeks   Status Achieved    PT SHORT TERM GOAL #6   Title edema will improve 2-3 cm to improve active range of RT knee and improve gait.    Time 4   Period Weeks   Status Achieved           PT Long Term Goals - 08/16/15 1803    PT LONG TERM GOAL #1   Title He will report pain decreased 60% or more with normal home tasks   Time 12   Period Weeks   Status Achieved   PT LONG TERM GOAL #2   Title He will return home with wife    Time 12   Period Weeks   Status Unable to assess   PT LONG TERM GOAL #3   Time 12   Period Weeks   Status Achieved   PT LONG TERM GOAL #4   Title He will walk safely with one rail step over step 12-20 steps for safe acess to up stairs and basement of home/.    Status Achieved   PT LONG TERM GOAL #5   Title He will return to some basic yard work with min pain   Baseline did fine with yardwork in [place]   Time 12   Period Weeks   Status Achieved               Plan - 08/16/15 1759    Clinical Impression Statement Heat helpful with stretching into extension,  Hip extension improved post pool exercises while in [place] which also has helped supine ROM Knee.  Patient thinks he can exercise on his own after he see's what MD says next week.  Needs ore extension   PT Next Visit Plan Measure , Extension focus .  Terminal knee at wall with [employer]?   Consulted and Agree with Plan of Care Patient        Problem List Patient Active Problem List   Diagnosis Date Noted  . Total knee replacement status 06/06/2015    [facility] 08/16/2015, 6:06 PM  [facility] [address] [place], [place], [address] Phone: [phone]   Fax:  [phone]  Name: Christian [name] MRN: [id number] Date of Birth: 05-02-1929    Liz Beach, PTA  11/01/201611-19-16M Phone: [phone] Fax: [phone]     G-Codes - 2015-09-03 1837    Functional Assessment Tool Used Clinical impression   Functional Limitation Mobility: Walking and  moving around   Mobility: Walking and Moving Around Current Status [phone]) At least 20 percent but less than 40 percent impaired, limited or restricted   Mobility: Walking and Moving Around Goal Status [phone]) At least 1 percent but less than 20 percent impaired, limited or restricted    Caprice Red, PT    2015-09-03   6:39 PM

## 2015-08-18 ENCOUNTER — Ambulatory Visit: Payer: Medicare Other | Admitting: Physical Therapy

## 2015-08-18 DIAGNOSIS — R6889 Other general symptoms and signs: Secondary | ICD-10-CM

## 2015-08-18 DIAGNOSIS — M25661 Stiffness of right knee, not elsewhere classified: Secondary | ICD-10-CM | POA: Diagnosis not present

## 2015-08-18 DIAGNOSIS — R29898 Other symptoms and signs involving the musculoskeletal system: Secondary | ICD-10-CM

## 2015-08-18 DIAGNOSIS — R609 Edema, unspecified: Secondary | ICD-10-CM

## 2015-08-18 DIAGNOSIS — R262 Difficulty in walking, not elsewhere classified: Secondary | ICD-10-CM

## 2015-08-18 NOTE — Therapy (Signed)
[facility] [address] [place], [place], [address] Phone: [phone]   Fax:  [phone]  Physical Therapy Treatment  Patient Details  Name: Christian [name] MRN: [id number] Date of Birth: November 28, 1928 No Data Recorded  Encounter Date: 08/18/2015      PT End of Session - 08/18/15 1416    Visit Number 11   Number of Visits 24   Date for PT Re-Evaluation 09/22/15   PT Start Time 1330   PT Stop Time 1416   PT Time Calculation (min) 46 min   Activity Tolerance Patient tolerated treatment well   Behavior During Therapy [facility] for tasks assessed/performed      Past Medical History  Diagnosis Date  . Diverticulosis   . Colon polyps   . Esophageal stricture   . Spinal stenosis   . Hypertension   . GERD (gastroesophageal reflux disease)   . Arthritis     R knee, L shoulder   . Voice hoarseness     Past Surgical History  Procedure Laterality Date  . Appendectomy    . Knee arthroscopy Left   . Rotator cuff repair Left   . Prostate surgery    . Back surgery      x2  . Knee arthroplasty Right 06/06/2015    Procedure: RIGHT COMPUTER ASSISTED TOTAL KNEE ARTHROPLASTY;  Surgeon: Eldred Manges, MD;  [facility] OR;  Service: Orthopedics;  Laterality: Right;    There were no vitals filed for this visit.  Visit Diagnosis:  Stiffness of knee joint, right  Weakness of right leg  Activity intolerance  Difficulty walking  Edema, unspecified type      Subjective Assessment - 08/18/15 1336    Subjective Ached all over yesterday.  It felt like the flu without the flu symptoms.  Had the flu shot a month ago.  Yesterday ran errands all day and did not have time to do exercises.  Legs feel weak at imes.     Currently in Pain? No/denies   Pain Score 2    Pain Location Knee   Pain Orientation Right   Pain Descriptors / Indicators Shooting  medial latera leg   Pain Frequency Rarely   Aggravating Factors  doing a lot   Pain  Relieving Factors rest                         [facility] Adult PT Treatment/Exercise - 08/18/15 1342    Knee/Hip Exercises: Standing   Knee Flexion Limitations 5 LBS 1 X stopped due to cramp   Lateral Step Up Limitations 6 " X 20 1 hand   Forward Step Up Limitations 6 X20   Knee/Hip Exercises: Seated   Long Arc Quad Limitations 20 X 6 LBS    Hamstring Limitations blue band  added to home 10 X   Knee/Hip Exercises: Supine   Quad Sets 20 reps   Short Arc Quad Sets 2 sets;20 reps  5 LBS   Heel Slides 10 reps   Manual Therapy   Manual therapy comments stretching, patellar mobilizations, joint mobs.  grade 2.  120 degrees flexion, 1 finger width off mat.                PT Education - 08/18/15 1346    Education provided Yes   Education Details exercise drawer band blue flexion   Person(s) Educated Patient   Methods Explanation;Demonstration;Verbal cues;Handout   Comprehension Verbalized understanding;Returned demonstration  PT Short Term Goals - 08/16/15 1802    PT SHORT TERM GOAL #1   Title He will be independent with intial HEP   Status Achieved   PT SHORT TERM GOAL #2   Title He will improve active flexion to 120 degrees   Time 4   Period Weeks   Status Achieved   PT SHORT TERM GOAL #3   Title He wil improve active RT knee extension to 10 degrees.    Time 4   Period Weeks   Status Achieved   PT SHORT TERM GOAL #4   Title He will be able to walk stairs step over step with 2 rail safely.    Time 4   Period Weeks   Status Achieved   PT SHORT TERM GOAL #5   Title He will report pain decreased 30% or more with walking   Baseline knee does not bother him anymore   Time 4   Period Weeks   Status Achieved   PT SHORT TERM GOAL #6   Title edema will improve 2-3 cm to improve active range of RT knee and improve gait.    Time 4   Period Weeks   Status Achieved           PT Long Term Goals - 08/16/15 1803    PT LONG TERM GOAL #1    Title He will report pain decreased 60% or more with normal home tasks   Time 12   Period Weeks   Status Achieved   PT LONG TERM GOAL #2   Title He will return home with wife    Time 12   Period Weeks   Status Unable to assess   PT LONG TERM GOAL #3   Time 12   Period Weeks   Status Achieved   PT LONG TERM GOAL #4   Title He will walk safely with one rail step over step 12-20 steps for safe acess to up stairs and basement of home/.    Status Achieved   PT LONG TERM GOAL #5   Title He will return to some basic yard work with min pain   Baseline did fine [place]work [place]   Time 12   Period Weeks   Status Achieved               Plan - 08/18/15 1416    Clinical Impression Statement 5/10 post stretching, declined modalities.  Patient does not maintain extension range gained during last session.   PT Next Visit Plan Measure , Extension focus .  Terminal knee at wall with [employer]?   PT Home Exercise Plan continue , nothing new added   Consulted and Agree with Plan of Care Patient        Problem List Patient Active Problem List   Diagnosis Date Noted  . Total knee replacement status 06/06/2015    [facility] 08/18/2015, 2:19 PM  [facility] [address] [place], [place], [address] Phone: [phone]   Fax:  [phone]  Name: Salvatore MarvelCharles E Dillon MRN: [id number] Date of Birth: 04-10-1929    Liz BeachKaren Harris, PTA 08/18/2015 2:19 PM Phone: [phone] Fax: [phone]

## 2015-08-23 ENCOUNTER — Ambulatory Visit: Payer: Medicare Other | Admitting: Physical Therapy

## 2015-08-23 DIAGNOSIS — R262 Difficulty in walking, not elsewhere classified: Secondary | ICD-10-CM

## 2015-08-23 DIAGNOSIS — M25661 Stiffness of right knee, not elsewhere classified: Secondary | ICD-10-CM

## 2015-08-23 DIAGNOSIS — R6889 Other general symptoms and signs: Secondary | ICD-10-CM

## 2015-08-23 DIAGNOSIS — R29898 Other symptoms and signs involving the musculoskeletal system: Secondary | ICD-10-CM

## 2015-08-23 NOTE — Therapy (Signed)
[facility] [address] [place], [place], [address] Phone: [phone]   Fax:  [phone]  Physical Therapy Treatment  Patient Details  Name: Christian [name] MRN: [id number] Date of Birth: [birth year] No Data Recorded  Encounter Date: 08/23/2015      PT End of Session - 08/23/15 1419    Visit Number 12   Date for PT Re-Evaluation 09/22/15   PT Start Time 1330   PT Stop Time 1430   PT Time Calculation (min) 60 min   Activity Tolerance Patient tolerated treatment well   Behavior During Therapy [facility] for tasks assessed/performed      Past Medical History  Diagnosis Date  . Diverticulosis   . Colon polyps   . Esophageal stricture   . Spinal stenosis   . Hypertension   . GERD (gastroesophageal reflux disease)   . Arthritis     R knee, L shoulder   . Voice hoarseness     Past Surgical History  Procedure Laterality Date  . Appendectomy    . Knee arthroscopy Left   . Rotator cuff repair Left   . Prostate surgery    . Back surgery      x2  . Knee arthroplasty Right 06/06/2015    Procedure: RIGHT COMPUTER ASSISTED TOTAL KNEE ARTHROPLASTY;  Surgeon: Eldred Manges, MD;  [facility]: [facility] OR;  Service: Orthopedics;  Laterality: Right;    There were no vitals filed for this visit.  Visit Diagnosis:  Stiffness of knee joint, right  Weakness of right leg  Activity intolerance  Difficulty walking      Subjective Assessment - 08/23/15 1332    Subjective Able to rake leaves.  Runs errands just fine.  Stretched leg into extension about 30 minutes last night.  It looks to me that it is straighter.  Sitting in one position is painful.  or any position too long.  Not yet back home, lives with his daughter while recovering.     Pain Score 8   varies for different sitting times.     Pain Location Knee   Pain Orientation Right   Pain Descriptors / Indicators Aching   Pain Frequency Intermittent   Aggravating Factors  sitting too  long   Pain Relieving Factors moving around                         [facility] Adult PT Treatment/Exercise - 08/23/15 1343    Ambulation/Gait   Gait Comments patient had 1 loss of balance ascending steps, not using rails.  Needs extra time and use of raoils for safety.  Min assist to keep from falling.     Knee/Hip Exercises: Aerobic   Recumbent Bike 6 minutes     Knee/Hip Exercises: Standing   Terminal Knee Extension Limitations 10 X 5 seconds.,  some back discomfort with back with this, no more than usual.   Knee/Hip Exercises: Seated   Hamstring Limitations blue band 10 x 2 sets RT knee   Knee/Hip Exercises: Supine   Short Arc Quad Sets --  8 LBS 10 X 10 seconds   Bridges Limitations 10 X over bolster, no increased pain Lt Hip.  6-7 back pain reported with this , better wit stopping exerc   Other Supine Knee/Hip Exercises terminal knee extension blue band in supine 10 X 5 seconds                  PT Short Term Goals -  08/16/15 1802    PT SHORT TERM GOAL #1   Title He will be independent with intial HEP   Status Achieved   PT SHORT TERM GOAL #2   Title He will improve active flexion to 120 degrees   Time 4   Period Weeks   Status Achieved   PT SHORT TERM GOAL #3   Title He wil improve active RT knee extension to 10 degrees.    Time 4   Period Weeks   Status Achieved   PT SHORT TERM GOAL #4   Title He will be able to walk stairs step over step with 2 rail safely.    Time 4   Period Weeks   Status Achieved   PT SHORT TERM GOAL #5   Title He will report pain decreased 30% or more with walking   Baseline knee does not bother him anymore   Time 4   Period Weeks   Status Achieved   PT SHORT TERM GOAL #6   Title edema will improve 2-3 cm to improve active range of RT knee and improve gait.    Time 4   Period Weeks   Status Achieved           PT Long Term Goals - 08/23/15 1423    PT LONG TERM GOAL #1   Title He will report pain decreased 60%  or more with normal home tasks   Time 12   Period Weeks   Status Achieved   PT LONG TERM GOAL #2   Title He will return home with wife    Baseline Stays with daughter while here for Rehab.   Time 12   Period Weeks   Status On-going   PT LONG TERM GOAL #3   Time 12   Period Weeks   Status Achieved   PT LONG TERM GOAL #4   Title He will walk safely with one rail step over step 12-20 steps for safe acess to up stairs and basement of home/.    Status Achieved   PT LONG TERM GOAL #5   Title He will return to some basic yard work with min pain   Time 12   Period Weeks   Status Achieved               Plan - 08/23/15 1420    Clinical Impression Statement Patient making gains with extension, -20 degrees prior to exercise.  Needs rails on steps, 1 Loss of balance today minimal assist needed.   PT Next Visit Plan Continue extension focus, add treminal knee extension to home?  Off 2 weeks after next visit.   PT Home Exercise Plan use rails on steps   Consulted and Agree with Plan of Care Patient        Problem List Patient Active Problem List   Diagnosis Date Noted  . Total knee replacement status 06/06/2015    [facility]Jamieson Hetland 08/23/2015, 2:26 PM  [facility] [address] [place], [place], [address] Phone: [phone]   Fax:  [phone]  Name: Salvatore MarvelCharles E Whittlesey MRN: [id number] Date of Birth: 1929-07-09    Liz BeachKaren Dianara Smullen, PTA 08/23/2015 2:26 PM Phone: [phone] Fax: [phone]

## 2015-08-25 ENCOUNTER — Ambulatory Visit: Payer: Medicare Other

## 2015-08-25 DIAGNOSIS — R6889 Other general symptoms and signs: Secondary | ICD-10-CM

## 2015-08-25 DIAGNOSIS — R262 Difficulty in walking, not elsewhere classified: Secondary | ICD-10-CM

## 2015-08-25 DIAGNOSIS — M25661 Stiffness of right knee, not elsewhere classified: Secondary | ICD-10-CM | POA: Diagnosis not present

## 2015-08-25 DIAGNOSIS — R29898 Other symptoms and signs involving the musculoskeletal system: Secondary | ICD-10-CM

## 2015-08-25 NOTE — Therapy (Signed)
[place], [place], [address] Phone: [phone]   Fax:  [phone]  Physical Therapy Treatment  Patient Details  Name: Christian [name] MRN: [id number] Date of Birth: 02/19/1929 No Data Recorded  Encounter Date: 08/25/2015      PT End of Session - 08/25/15 1055    Visit Number 14   Number of Visits 24   Date for PT Re-Evaluation 09/30/15   PT Start Time 1219   PT Stop Time 1100   PT Time Calculation (min) 45 min   Activity Tolerance Patient tolerated treatment well   Behavior During Therapy [facility] for tasks assessed/performed      Past Medical History  Diagnosis Date  . Diverticulosis   . Colon polyps   . Esophageal stricture   . Spinal stenosis   . Hypertension   . GERD (gastroesophageal reflux disease)   . Arthritis     R knee, L shoulder   . Voice hoarseness     Past Surgical History  Procedure Laterality Date  . Appendectomy    . Knee arthroscopy Left   . Rotator cuff repair Left   . Prostate surgery    . Back surgery      x2  . Knee arthroplasty Right 06/06/2015    Procedure: RIGHT COMPUTER ASSISTED TOTAL KNEE ARTHROPLASTY;  Surgeon: Marybelle Killings, MD;  Location: [facility];  Service: Orthopedics;  Laterality: Right;    There were no vitals filed for this visit.  Visit Diagnosis:  Stiffness of knee joint, right - Plan: PT plan of care cert/re-cert  Weakness of right leg - Plan: PT plan of care cert/re-cert  Activity intolerance - Plan: PT plan of care cert/re-cert  Difficulty walking - Plan: PT plan of care cert/re-cert      Subjective Assessment - 08/25/15 1020    Subjective No pain today. Static positions are painful.    Currently in Pain? No/denies            [facility] PT Assessment - 08/25/15 0001    AROM   Right Knee Extension -13   Right Knee Flexion 123   PROM   Right Knee Extension -10   Right Knee Flexion 125                     [facility] Adult PT  Treatment/Exercise - 08/25/15 1021    Ambulation/Gait   Number of Stairs 20   Height of Stairs 6  light touch to one rail   Knee/Hip Exercises: Stretches   Other Knee/Hip Stretches prone leg hang x 3 min 3 pounds. PT says he doew30 min 1x/day   Knee/Hip Exercises: Aerobic   Recumbent Bike 6 minutes  L3     Spent time with range /stretching and QS with extension stretching.  Reviewed activity at home and limits which appear hea does not have any. Range still limited but progressing.            PT Education - 08/25/15 1055    Education provided Yes   Education Details extension stretch   Person(s) Educated Patient   Methods Explanation;Demonstration;Tactile cues;Verbal cues;Handout   Comprehension Returned demonstration          PT Short Term Goals - 08/16/15 1802    PT SHORT TERM GOAL #1   Title He will be independent with intial HEP   Status Achieved   PT SHORT TERM GOAL #2   Title He will improve active flexion to  120 degrees   Time 4   Period Weeks   Status Achieved   PT SHORT TERM GOAL #3   Title He wil improve active RT knee extension to 10 degrees.    Time 4   Period Weeks   Status Achieved   PT SHORT TERM GOAL #4   Title He will be able to walk stairs step over step with 2 rail safely.    Time 4   Period Weeks   Status Achieved   PT SHORT TERM GOAL #5   Title He will report pain decreased 30% or more with walking   Baseline knee does not bother him anymore   Time 4   Period Weeks   Status Achieved   PT SHORT TERM GOAL #6   Title edema will improve 2-3 cm to improve active range of RT knee and improve gait.    Time 4   Period Weeks   Status Achieved           PT Long Term Goals - 08/23/15 1423    PT LONG TERM GOAL #1   Title He will report pain decreased 60% or more with normal home tasks   Time 12   Period Weeks   Status Achieved   PT LONG TERM GOAL #2   Title He will return home with wife    Baseline Stays with daughter while here for  Rehab.   Time 12   Period Weeks   Status On-going   PT LONG TERM GOAL #3   Time 12   Period Weeks   Status Achieved   PT LONG TERM GOAL #4   Title He will walk safely with one rail step over step 12-20 steps for safe acess to up stairs and basement of home/.    Status Achieved   PT LONG TERM GOAL #5   Title He will return to some basic yard work with min pain   Time 12   Period Weeks   Status Achieved               Plan - 08/25/15 1023    Clinical Impression Statement He is doing well and may be ready for discharge shortly after he returns from vacation. All but one LTG met  If he is doing well after 2 visits will discharge   PT Next Visit Plan Continue extension focus,  Off 2 weeks after next visit. Add band TKE   Consulted and Agree with Plan of Care Patient        Problem List Patient Active Problem List   Diagnosis Date Noted  . Total knee replacement status 06/06/2015    [name] PT 08/25/2015, 11:02 AM  [facility] [address] [place], [place], [address] Phone: [phone]   Fax:  [phone]  Name: ARMSTEAD HEILAND MRN: [id number] Date of Birth: 1929/02/26

## 2015-08-25 NOTE — Patient Instructions (Signed)
From cabinet options on extension stretching  Seated and supine for 10-30 min  Along with prone stretch

## 2015-09-01 ENCOUNTER — Encounter: Payer: Medicare Other | Admitting: Physical Therapy

## 2015-09-12 ENCOUNTER — Ambulatory Visit: Payer: Medicare Other

## 2015-09-12 DIAGNOSIS — M25661 Stiffness of right knee, not elsewhere classified: Secondary | ICD-10-CM | POA: Diagnosis not present

## 2015-09-12 DIAGNOSIS — R29898 Other symptoms and signs involving the musculoskeletal system: Secondary | ICD-10-CM

## 2015-09-12 NOTE — Therapy (Signed)
Christian [name], [place], [address] Phone: [phone]   Fax:  [phone]  Physical Therapy Treatment  Patient Details  Name: Christian [name] MRN: [id number] Date of Birth: Jan 23, 1929 No Data Recorded  Encounter Date: 09/12/2015      PT End of Session - 09/12/15 1422    Visit Number 15   Number of Visits 24   PT Start Time 0130   PT Stop Time 0218   PT Time Calculation (min) 48 min   Activity Tolerance Patient tolerated treatment well   Behavior During Therapy [facility] for tasks assessed/performed      Past Medical History  Diagnosis Date  . Diverticulosis   . Colon polyps   . Esophageal stricture   . Spinal stenosis   . Hypertension   . GERD (gastroesophageal reflux disease)   . Arthritis     R knee, L shoulder   . Voice hoarseness     Past Surgical History  Procedure Laterality Date  . Appendectomy    . Knee arthroscopy Left   . Rotator cuff repair Left   . Prostate surgery    . Back surgery      x2  . Knee arthroplasty Right 06/06/2015    Procedure: RIGHT COMPUTER ASSISTED TOTAL KNEE ARTHROPLASTY;  Surgeon: Marybelle Killings, MD;  Location: [facility];  Service: Orthopedics;  Laterality: Right;    There were no vitals filed for this visit.  Visit Diagnosis:  Weakness of right leg  Stiffness of knee joint, right      Subjective Assessment - 09/12/15 1338    Subjective Occasional shooting pains when sitting still. Shoulders sore from lifting bags of cat litter. No knee pain.    Currently in Pain? No/denies            [facility] PT Assessment - 09/12/15 0001    AROM   Right Knee Extension -10   Right Knee Flexion 125   PROM   Right Knee Extension -8   Right Knee Flexion 127   Strength   Overall Strength Comments He is able to step up to 11 inch step with rails without difficluty RT and LT,  Stairs with problem with touch to one rail ascen and descend   Ambulation/Gait   Number of Stairs 20    Height of Stairs 6  good control with one rail.    High Level Balance   High Level Balance Comments Single leg balance x 3 trials with best time of 10 second s each leg                     [facility] Adult PT Treatment/Exercise - 09/12/15 0001    Knee/Hip Exercises: Aerobic   Recumbent Bike 6 minutes  L3     Stairs x 20 steps one rail without problem, Single leg stand trials 10 seconds best RT and LT.  Step ups on 8 and 11 inch step x15 RT and LT.  QS and SAQ, extension stretching. Discussed HEP and need to continue 6-9 minths at least and to work up to 30 min if possible on recumbent bike at home.  Walk on soft surface and incline soft without LOB            PT Short Term Goals - 08/16/15 1802    PT SHORT TERM GOAL #1   Title He will be independent with intial HEP   Status Achieved   PT SHORT TERM GOAL #2  Title He will improve active flexion to 120 degrees   Time 4   Period Weeks   Status Achieved   PT SHORT TERM GOAL #3   Title He wil improve active RT knee extension to 10 degrees.    Time 4   Period Weeks   Status Achieved   PT SHORT TERM GOAL #4   Title He will be able to walk stairs step over step with 2 rail safely.    Time 4   Period Weeks   Status Achieved   PT SHORT TERM GOAL #5   Title He will report pain decreased 30% or more with walking   Baseline knee does not bother him anymore   Time 4   Period Weeks   Status Achieved   PT SHORT TERM GOAL #6   Title edema will improve 2-3 cm to improve active range of RT knee and improve gait.    Time 4   Period Weeks   Status Achieved           PT Long Term Goals - 10/02/2015 1340    PT LONG TERM GOAL #1   Title He will report pain decreased 60% or more with normal home tasks   Status Achieved   PT LONG TERM GOAL #2   Title He will return home with wife    Baseline He plans to return home after discharge   Status Achieved   PT LONG TERM GOAL #3   Title He will walk in community without  device   Status Achieved   PT LONG TERM GOAL #4   Title He will walk safely with one rail step over step 12-20 steps for safe acess to up stairs and basement of home/.    Status Achieved   PT LONG TERM GOAL #5   Title He will return to some basic yard work with min pain   Status Achieved               Plan - October 02, 2015 1422    Clinical Impression Statement [name] agreed to discharge . He reports no functional limitations with RT TKA. He has other painful areas. He will see MD .  All Goals met.    PT Next Visit Plan Discharge today   Consulted and Agree with Plan of Care Patient          G-Codes - 10-02-2015 1424    Functional Assessment Tool Used FOTO 45%   Functional Limitation Mobility: Walking and moving around   Mobility: Walking and Moving Around Current Status [phone]) At least 40 percent but less than 60 percent impaired, limited or restricted   Mobility: Walking and Moving Around Goal Status [phone]) At least 40 percent but less than 60 percent impaired, limited or restricted   Mobility: Walking and Moving Around Discharge Status [phone]) At least 40 percent but less than 60 percent impaired, limited or restricted      Problem List Patient Active Problem List   Diagnosis Date Noted  . Total knee replacement status 06/06/2015    [name] PT October 02, 2015, 2:34 PM  [facility] [address] [place], [place], [address] Phone: [phone]   Fax:  [phone]  Name: Christian [name] MRN: [id number] Date of Birth: 1929/09/12    PHYSICAL THERAPY DISCHARGE SUMMARY  Visits from Start of Care: 15  Current functional level related to goals / functional outcomes: See above   Remaining deficits: Functionally independent with some slight  decreased ROM extension    Education / Equipment: HEP  Plan: Patient agrees to discharge.  Patient goals were met. Patient is being discharged due to meeting the stated  rehab goals.  ?????

## 2015-12-09 ENCOUNTER — Encounter (HOSPITAL_COMMUNITY): Payer: Self-pay

## 2015-12-09 ENCOUNTER — Observation Stay (HOSPITAL_COMMUNITY)
Admission: EM | Admit: 2015-12-09 | Discharge: 2015-12-11 | Disposition: A | Discharge status: Home / Self Care | Payer: Medicare Other | Attending: Family Medicine | Admitting: Family Medicine

## 2015-12-09 ENCOUNTER — Observation Stay (HOSPITAL_COMMUNITY): Payer: Medicare Other

## 2015-12-09 ENCOUNTER — Emergency Department (HOSPITAL_COMMUNITY): Payer: Medicare Other

## 2015-12-09 DIAGNOSIS — Z96651 Presence of right artificial knee joint: Secondary | ICD-10-CM | POA: Diagnosis not present

## 2015-12-09 DIAGNOSIS — K219 Gastro-esophageal reflux disease without esophagitis: Secondary | ICD-10-CM | POA: Insufficient documentation

## 2015-12-09 DIAGNOSIS — M7989 Other specified soft tissue disorders: Secondary | ICD-10-CM | POA: Diagnosis not present

## 2015-12-09 DIAGNOSIS — M19012 Primary osteoarthritis, left shoulder: Secondary | ICD-10-CM | POA: Insufficient documentation

## 2015-12-09 DIAGNOSIS — I10 Essential (primary) hypertension: Secondary | ICD-10-CM | POA: Diagnosis not present

## 2015-12-09 DIAGNOSIS — G459 Transient cerebral ischemic attack, unspecified: Principal | ICD-10-CM | POA: Diagnosis present

## 2015-12-09 DIAGNOSIS — Z79899 Other long term (current) drug therapy: Secondary | ICD-10-CM | POA: Diagnosis not present

## 2015-12-09 DIAGNOSIS — E785 Hyperlipidemia, unspecified: Secondary | ICD-10-CM | POA: Insufficient documentation

## 2015-12-09 DIAGNOSIS — R531 Weakness: Secondary | ICD-10-CM | POA: Diagnosis present

## 2015-12-09 LAB — URINALYSIS, ROUTINE W REFLEX MICROSCOPIC
Bilirubin Urine: NEGATIVE
GLUCOSE, UA: NEGATIVE mg/dL
Hgb urine dipstick: NEGATIVE
KETONES UR: NEGATIVE mg/dL
LEUKOCYTES UA: NEGATIVE
Nitrite: NEGATIVE
PH: 6.5 (ref 5.0–8.0)
Protein, ur: NEGATIVE mg/dL
SPECIFIC GRAVITY, URINE: 1.017 (ref 1.005–1.030)

## 2015-12-09 LAB — COMPREHENSIVE METABOLIC PANEL
ALT: 26 U/L (ref 17–63)
ANION GAP: 12 (ref 5–15)
AST: 53 U/L — ABNORMAL HIGH (ref 15–41)
Albumin: 3.7 g/dL (ref 3.5–5.0)
Alkaline Phosphatase: 133 U/L — ABNORMAL HIGH (ref 38–126)
BUN: 19 mg/dL (ref 6–20)
CALCIUM: 9.3 mg/dL (ref 8.9–10.3)
CHLORIDE: 103 mmol/L (ref 101–111)
CO2: 25 mmol/L (ref 22–32)
Creatinine, Ser: 1.1 mg/dL (ref 0.61–1.24)
GFR calc non Af Amer: 60 mL/min — ABNORMAL LOW (ref >60)
Glucose, Bld: 131 mg/dL — ABNORMAL HIGH (ref 65–99)
Potassium: 4.5 mmol/L (ref 3.5–5.1)
SODIUM: 140 mmol/L (ref 135–145)
Total Bilirubin: 0.9 mg/dL (ref 0.3–1.2)
Total Protein: 5.6 g/dL — ABNORMAL LOW (ref 6.5–8.1)

## 2015-12-09 LAB — DIFFERENTIAL
BASOS PCT: 1 %
Basophils Absolute: 0 10*3/uL (ref 0.0–0.1)
EOS ABS: 0.4 10*3/uL (ref 0.0–0.7)
EOS PCT: 8 %
Lymphocytes Relative: 25 %
Lymphs Abs: 1.2 10*3/uL (ref 0.7–4.0)
MONO ABS: 0.5 10*3/uL (ref 0.1–1.0)
Monocytes Relative: 11 %
Neutro Abs: 2.8 10*3/uL (ref 1.7–7.7)
Neutrophils Relative %: 55 %

## 2015-12-09 LAB — I-STAT TROPONIN, ED: Troponin i, poc: 0.04 ng/mL (ref 0.00–0.08)

## 2015-12-09 LAB — RAPID URINE DRUG SCREEN, HOSP PERFORMED
Amphetamines: NOT DETECTED
Barbiturates: NOT DETECTED
Benzodiazepines: NOT DETECTED
COCAINE: NOT DETECTED
OPIATES: NOT DETECTED
TETRAHYDROCANNABINOL: NOT DETECTED

## 2015-12-09 LAB — CBC
HCT: 40.5 % (ref 39.0–52.0)
Hemoglobin: 13.2 g/dL (ref 13.0–17.0)
MCH: 28.1 pg (ref 26.0–34.0)
MCHC: 32.6 g/dL (ref 30.0–36.0)
MCV: 86.2 fL (ref 78.0–100.0)
PLATELETS: 198 10*3/uL (ref 150–400)
RBC: 4.7 MIL/uL (ref 4.22–5.81)
RDW: 16.5 % — AB (ref 11.5–15.5)
WBC: 4.9 10*3/uL (ref 4.0–10.5)

## 2015-12-09 LAB — I-STAT CHEM 8, ED
BUN: 22 mg/dL — ABNORMAL HIGH (ref 6–20)
CALCIUM ION: 1.12 mmol/L — AB (ref 1.13–1.30)
CHLORIDE: 102 mmol/L (ref 101–111)
Creatinine, Ser: 1.2 mg/dL (ref 0.61–1.24)
Glucose, Bld: 123 mg/dL — ABNORMAL HIGH (ref 65–99)
HCT: 42 % (ref 39.0–52.0)
Hemoglobin: 14.3 g/dL (ref 13.0–17.0)
POTASSIUM: 4.3 mmol/L (ref 3.5–5.1)
SODIUM: 140 mmol/L (ref 135–145)
TCO2: 24 mmol/L (ref 0–100)

## 2015-12-09 MED ORDER — ENOXAPARIN SODIUM 40 MG/0.4ML ~~LOC~~ SOLN
40.0000 mg | SUBCUTANEOUS | Status: DC
  Administered 2015-12-10 – 2015-12-11 (×2): 40 mg via SUBCUTANEOUS
  Filled 2015-12-09 (×2): qty 0.4

## 2015-12-09 MED ORDER — LORATADINE 10 MG PO TABS
10.0000 mg | ORAL_TABLET | Freq: Every day | ORAL | Status: DC
  Administered 2015-12-10 – 2015-12-11 (×2): 10 mg via ORAL
  Filled 2015-12-09 (×2): qty 1

## 2015-12-09 MED ORDER — ASPIRIN 325 MG PO TABS
325.0000 mg | ORAL_TABLET | Freq: Once | ORAL | Status: AC
  Administered 2015-12-09: 325 mg via ORAL
  Filled 2015-12-09: qty 1

## 2015-12-09 MED ORDER — ASPIRIN EC 325 MG PO TBEC
325.0000 mg | DELAYED_RELEASE_TABLET | Freq: Every day | ORAL | Status: DC
  Administered 2015-12-10 – 2015-12-11 (×2): 325 mg via ORAL
  Filled 2015-12-09 (×2): qty 1

## 2015-12-09 MED ORDER — LISINOPRIL 10 MG PO TABS: 10.0000 mg | ORAL_TABLET | Freq: Every day | ORAL | Status: DC

## 2015-12-09 MED ORDER — PANTOPRAZOLE SODIUM 40 MG PO TBEC
40.0000 mg | DELAYED_RELEASE_TABLET | Freq: Every day | ORAL | Status: DC
  Administered 2015-12-10 – 2015-12-11 (×2): 40 mg via ORAL
  Filled 2015-12-09 (×2): qty 1

## 2015-12-09 MED ORDER — STROKE: EARLY STAGES OF RECOVERY BOOK
Freq: Once | Status: AC
  Administered 2015-12-09: 23:00:00
  Filled 2015-12-09: qty 1

## 2015-12-09 MED ORDER — SENNOSIDES-DOCUSATE SODIUM 8.6-50 MG PO TABS
1.0000 | ORAL_TABLET | Freq: Every day | ORAL | Status: DC
  Filled 2015-12-09: qty 1

## 2015-12-09 MED ORDER — SODIUM CHLORIDE 0.9 % IV SOLN
Freq: Once | INTRAVENOUS | Status: AC
  Administered 2015-12-10: 06:00:00 via INTRAVENOUS

## 2015-12-09 MED ORDER — ADULT MULTIVITAMIN W/MINERALS CH
1.0000 | ORAL_TABLET | Freq: Every day | ORAL | Status: DC
  Administered 2015-12-10 – 2015-12-11 (×2): 1 via ORAL
  Filled 2015-12-09 (×2): qty 1

## 2015-12-09 MED ORDER — LISINOPRIL 10 MG PO TABS: 10.0000 mg | ORAL_TABLET | Freq: Every day | ORAL | Status: DC | PRN

## 2015-12-09 MED ORDER — VITAMIN B-12 100 MCG PO TABS
50.0000 ug | ORAL_TABLET | Freq: Every day | ORAL | Status: DC
  Administered 2015-12-10: 50 ug via ORAL
  Filled 2015-12-09: qty 1

## 2015-12-09 NOTE — ED Provider Notes (Signed)
Patient had 2 episodes of left arm and left leg weakness between 6:30 PM and 7:05 PM tonight each lasting a few minutes. He is presently asymptomatic, without treatment. On exam alert Glasgow Coma Score 15 cranial nerves II through XII intact moves all extremities well  Doug Sou, MD 12/09/15 2118

## 2015-12-09 NOTE — ED Provider Notes (Signed)
CSN: [id number]     Arrival date & time 12/09/15  1951 History   First MD Initiated Contact with Patient 12/09/15 2001     Chief Complaint  Patient presents with  . Paralysis   Patient is a 82 y.o. male presenting with neurologic complaint. The history is provided by the patient and a relative. No language interpreter was used.  Neurologic Problem This is a new problem. The current episode started today. The problem occurs intermittently. The problem has been resolved. Associated symptoms include weakness. Pertinent negatives include no abdominal pain, chest pain, chills, congestion, coughing, fever, headaches, joint swelling, nausea, neck pain, numbness, rash, sore throat, visual change or vomiting. Nothing aggravates the symptoms. He has tried nothing for the symptoms. The treatment provided no relief.    Past Medical History  Diagnosis Date  . Diverticulosis   . Colon polyps   . Esophageal stricture   . Spinal stenosis   . Hypertension   . GERD (gastroesophageal reflux disease)   . Arthritis     R knee, L shoulder   . Voice hoarseness    Past Surgical History  Procedure Laterality Date  . Appendectomy    . Knee arthroscopy Left   . Rotator cuff repair Left   . Prostate surgery    . Back surgery      x2  . Knee arthroplasty Right 06/06/2015    Procedure: RIGHT COMPUTER ASSISTED TOTAL KNEE ARTHROPLASTY;  Surgeon: Eldred Manges, MD;  Location: [facility];  Service: Orthopedics;  Laterality: Right;   Family History  Problem Relation Age of Onset  . Heart disease Mother    Social History  Substance Use Topics  . Smoking status: Never Smoker   . Smokeless tobacco: Never Used  . Alcohol Use: No    Review of Systems  Constitutional: Negative for fever, chills, activity change and appetite change.  HENT: Negative for congestion, dental problem, ear pain, facial swelling, hearing loss, rhinorrhea, sneezing, sore throat, trouble swallowing and voice change.   Eyes: Negative for  photophobia, pain, redness and visual disturbance.  Respiratory: Negative for apnea, cough, chest tightness, shortness of breath, wheezing and stridor.   Cardiovascular: Negative for chest pain, palpitations and leg swelling.  Gastrointestinal: Negative for nausea, vomiting, abdominal pain, diarrhea, constipation, blood in stool and abdominal distention.  Endocrine: Negative for polydipsia and polyuria.  Genitourinary: Negative for frequency, hematuria, flank pain, decreased urine volume and difficulty urinating.  Musculoskeletal: Negative for back pain, joint swelling, gait problem, neck pain and neck stiffness.  Skin: Negative for rash and wound.  Allergic/Immunologic: Negative for immunocompromised state.  Neurological: Positive for weakness. Negative for dizziness, syncope, facial asymmetry, speech difficulty, light-headedness, numbness and headaches.  Hematological: Negative for adenopathy.  Psychiatric/Behavioral: Negative for suicidal ideas, behavioral problems, confusion, sleep disturbance and agitation. The patient is not nervous/anxious.   All other systems reviewed and are negative.     Allergies  Review of patient's allergies indicates no known allergies.  Home Medications   Prior to Admission medications   Medication Sig Start Date End Date Taking? Authorizing Provider  cetirizine (ZYRTEC) 10 MG tablet Take 10 mg by mouth daily as needed for allergies.   Yes Historical Provider, MD  Glucosamine-Chondroitin 250-200 MG TABS Take 1 tablet by mouth daily.   Yes Historical Provider, MD  lisinopril (PRINIVIL,ZESTRIL) 10 MG tablet Take 10 mg by mouth daily as needed (systolic >135). Pt. Takes Lisinopril only if BP is 130 systolic or >   Yes Historical Provider,  MD  Multiple Vitamins-Minerals (CENTRUM SILVER ADULT 50+ PO) Take 1 tablet by mouth daily.   Yes Historical Provider, MD  Omega-3 Fatty Acids (FISH OIL) 1000 MG CAPS Take 1 capsule by mouth daily.   Yes Historical Provider,  MD  omeprazole (PRILOSEC OTC) 20 MG tablet Take 20 mg by mouth daily before breakfast.    Yes Historical Provider, MD  OVER THE COUNTER MEDICATION Take 1 tablet by mouth daily.   Yes Historical Provider, MD  vitamin B-12 (CYANOCOBALAMIN) 100 MCG tablet Take 50 mcg by mouth daily.   Yes Historical Provider, MD  aspirin EC 325 MG EC tablet Take 1 tablet (325 mg total) by mouth daily with breakfast. Patient not taking: Reported on 12/09/2015 06/09/15   Naida Sleight, PA-C  HYDROcodone-acetaminophen [facility]) 10-325 MG per tablet Take 1-2 tablets by mouth every 6 (six) hours as needed for severe pain. Patient not taking: Reported on 12/09/2015 06/09/15   Naida Sleight, PA-C   BP 156/72 mmHg  Pulse 66  Temp(Src) 97.7 F (36.5 C) (Oral)  Resp 22  Ht 5\' 10"  (1.778 m)  Wt 75.07 kg  BMI 23.75 kg/m2  SpO2 96% Physical Exam  Constitutional: He is oriented to person, place, and time. He appears well-developed and well-nourished. No distress.  HENT:  Head: Normocephalic and atraumatic.  Right Ear: External ear normal.  Left Ear: External ear normal.  Eyes: Pupils are equal, round, and reactive to light. Right eye exhibits no discharge. Left eye exhibits no discharge.  Neck: Normal range of motion. No JVD present. No tracheal deviation present.  Cardiovascular: Normal rate, regular rhythm and normal heart sounds.  Exam reveals no friction rub.   No murmur heard. Pulmonary/Chest: Effort normal and breath sounds normal. No stridor. No respiratory distress. He has no wheezes.  Abdominal: Soft. Bowel sounds are normal. He exhibits no distension. There is no rebound and no guarding.  Musculoskeletal: Normal range of motion. He exhibits no edema or tenderness.  Lymphadenopathy:    He has no cervical adenopathy.  Neurological: He is alert and oriented to person, place, and time. He has normal strength. He displays no tremor. No cranial nerve deficit or sensory deficit. He exhibits normal muscle tone.  Coordination normal. GCS eye subscore is 4. GCS verbal subscore is 5. GCS motor subscore is 6.  Cranial nerve exam normal. Strength and sensation normal bilateral upper and lower extremities. Normal finger to nose, heel-to-shin, rapid alternating movements. No pronator drift. GCS 15.  Skin: Skin is warm and dry. No rash noted. No pallor.  Psychiatric: He has a normal mood and affect. His behavior is normal. Judgment and thought content normal.  Nursing note and vitals reviewed.   ED Course  Procedures (including critical care time) Labs Review Labs Reviewed  CBC - Abnormal; Notable for the following:    RDW 16.5 (*)    All other components within normal limits  COMPREHENSIVE METABOLIC PANEL - Abnormal; Notable for the following:    Glucose, Bld 131 (*)    Total Protein 5.6 (*)    AST 53 (*)    Alkaline Phosphatase 133 (*)    GFR calc non Af Amer 60 (*)    All other components within normal limits  BASIC METABOLIC PANEL - Abnormal; Notable for the following:    Glucose, Bld 112 (*)    All other components within normal limits  I-STAT CHEM 8, ED - Abnormal; Notable for the following:    BUN 22 (*)  Glucose, Bld 123 (*)    Calcium, Ion 1.12 (*)    All other components within normal limits  DIFFERENTIAL  URINE RAPID DRUG SCREEN, HOSP PERFORMED  URINALYSIS, ROUTINE W REFLEX MICROSCOPIC (NOT AT [facility])  HEMOGLOBIN A1C  LIPID PANEL  VITAMIN B12  RPR  TSH  SEDIMENTATION RATE  C-REACTIVE PROTEIN  MAGNESIUM  HIV ANTIBODY (ROUTINE TESTING)  CALCIUM, IONIZED  I-STAT TROPOININ, ED    Imaging Review Dg Chest 2 View  12/09/2015  CLINICAL DATA:  TIA. Episodes of left-sided weakness lasting 5 minutes. EXAM: CHEST  2 VIEW COMPARISON:  05/26/2015 FINDINGS: Again seen elevation of right hemidiaphragm, minimally increased in degree from prior. Adjacent atelectasis or scarring at the right lung base. Heart at the upper limits normal in size, mediastinal contours are unchanged. No pulmonary  edema, pleural effusion, pneumothorax or focal consolidation. Mild degenerative change in the spine. IMPRESSION: Elevation of right hemidiaphragm, with minimal progression from prior. Otherwise no acute process. Electronically Signed   By: Rubye Oaks M.D.   On: 12/09/2015 23:05   Mr Brain Wo Contrast  12/09/2015  CLINICAL DATA:  Two 5 minutes episodes of unilateral LEFT-sided weakness today, spontaneous resolution. History of hypertension. EXAM: MRI HEAD WITHOUT CONTRAST MRA HEAD WITHOUT CONTRAST TECHNIQUE: Multiplanar, multiecho pulse sequences of the brain and surrounding structures were obtained without intravenous contrast. Angiographic images of the head were obtained using MRA technique without contrast. COMPARISON:  None. FINDINGS: MRI HEAD FINDINGS The ventricles and sulci are normal for patient's age. No abnormal parenchymal signal, mass lesions, mass effect. No reduced diffusion to suggest acute ischemia. LEFT inferior basal ganglia small perivascular space. Patchy to confluent supratentorial and pontine white matter FLAIR T2 hyperintensities. Small area LEFT posterior temporal lobe susceptibility artifact without mass-effect or surrounding edema. No abnormal extra-axial fluid collections. No extra-axial masses though, contrast enhanced sequences would be more sensitive. Normal major intracranial vascular flow voids seen at the skull base. Ocular globes and orbital contents are unremarkable though not tailored for evaluation. No abnormal sellar expansion. No suspicious calvarial bone marrow signal. Craniocervical junction maintained. Visualized paranasal sinuses and mastoid air cells are well-aerated. MRA HEAD FINDINGS Anterior circulation: Normal flow related enhancement of the included cervical, petrous, cavernous and supraclinoid internal carotid arteries. Patent anterior communicating artery. Normal flow related enhancement of the anterior and middle cerebral arteries, including distal  segments. No large vessel occlusion, high-grade stenosis, abnormal luminal irregularity, aneurysm. Posterior circulation: RIGHT vertebral artery is dominant. Basilar artery is patent, with normal flow related enhancement of the main branch vessels. Flow related enhancement of the posterior cerebral arteries. High-grade stenosis LEFT P3 segment. No large vessel occlusion, abnormal luminal irregularity, aneurysm. IMPRESSION: MRI HEAD: No acute intracranial process, specifically no acute ischemia. Involutional changes and moderate chronic small vessel ischemic disease. Susceptibility artifact LEFT posterior temporal lobe may represent old hemorrhage or cavernoma without acute component. MRA HEAD: No acute large vessel occlusion. High-grade stenosis LEFT P3 segment. Electronically Signed   By: Awilda Metro M.D.   On: 12/09/2015 23:29   Mr [name] Head/brain Wo Cm  12/09/2015  CLINICAL DATA:  Two 5 minutes episodes of unilateral LEFT-sided weakness today, spontaneous resolution. History of hypertension. EXAM: MRI HEAD WITHOUT CONTRAST MRA HEAD WITHOUT CONTRAST TECHNIQUE: Multiplanar, multiecho pulse sequences of the brain and surrounding structures were obtained without intravenous contrast. Angiographic images of the head were obtained using MRA technique without contrast. COMPARISON:  None. FINDINGS: MRI HEAD FINDINGS The ventricles and sulci are normal for patient's age. No abnormal  parenchymal signal, mass lesions, mass effect. No reduced diffusion to suggest acute ischemia. LEFT inferior basal ganglia small perivascular space. Patchy to confluent supratentorial and pontine white matter FLAIR T2 hyperintensities. Small area LEFT posterior temporal lobe susceptibility artifact without mass-effect or surrounding edema. No abnormal extra-axial fluid collections. No extra-axial masses though, contrast enhanced sequences would be more sensitive. Normal major intracranial vascular flow voids seen at the skull base.  Ocular globes and orbital contents are unremarkable though not tailored for evaluation. No abnormal sellar expansion. No suspicious calvarial bone marrow signal. Craniocervical junction maintained. Visualized paranasal sinuses and mastoid air cells are well-aerated. MRA HEAD FINDINGS Anterior circulation: Normal flow related enhancement of the included cervical, petrous, cavernous and supraclinoid internal carotid arteries. Patent anterior communicating artery. Normal flow related enhancement of the anterior and middle cerebral arteries, including distal segments. No large vessel occlusion, high-grade stenosis, abnormal luminal irregularity, aneurysm. Posterior circulation: RIGHT vertebral artery is dominant. Basilar artery is patent, with normal flow related enhancement of the main branch vessels. Flow related enhancement of the posterior cerebral arteries. High-grade stenosis LEFT P3 segment. No large vessel occlusion, abnormal luminal irregularity, aneurysm. IMPRESSION: MRI HEAD: No acute intracranial process, specifically no acute ischemia. Involutional changes and moderate chronic small vessel ischemic disease. Susceptibility artifact LEFT posterior temporal lobe may represent old hemorrhage or cavernoma without acute component. MRA HEAD: No acute large vessel occlusion. High-grade stenosis LEFT P3 segment. Electronically Signed   By: Awilda Metro M.D.   On: 12/09/2015 23:29   I have personally reviewed and evaluated these images and lab results as part of my medical decision-making.   EKG Interpretation   Date/Time:  Friday December 09 2015 19:57:22 EST Ventricular Rate:  78 PR Interval:  161 QRS Duration: 90 QT Interval:  385 QTC Calculation: 438 R Axis:   -59 Text Interpretation:  Sinus rhythm Atrial premature complexes Left  anterior fascicular block Abnormal R-wave progression, early transition No  significant change since last tracing Confirmed by Ethelda Chick  MD, SAM  [phone]) on  12/09/2015 8:06:27 PM      MDM   Final diagnoses:  TIA (transient ischemic attack)  Left leg swelling    Patient with no significant past medical history presents for evaluation of 2 episodes of left arm and left leg weakness between 6:30 and 7:05 tonight. Each episode lasted 5-10 minutes area he has complete resolution upon arrival.  Patient afebrile with normal vital signs. He has no neurologic deficits.   No indication for code stroke.  Concern for TIA given left upper and left lower extremity weakness.  UA, UDS, BMP, troponin negative for acute findings.  Do not suspect bleed, will forego CT head as patient will need MRI/MRA as inpatient.  Patient given 325 aspirin in the emergency department and admitted to hospital service for TIA workup.  Discussed case with my attending, Dr. Ethelda Chick.    Dan Humphreys, MD 12/10/15 2952  Doug Sou, MD 12/10/15 [phone]

## 2015-12-09 NOTE — H&P (Signed)
[facility] History and Physical  Christian [name]:[id number] DOB: 23-Apr-1929 DOA: 12/09/2015  Referring physician:ED PCP: Geoffery Lyons, MD   Chief Complaint:  Left-sided weakness  HPI:   Christian [name] is a 82 year old male with a past medical history significant for HTN; who presents with complaints of  2 episodes of left-sided weakness today. Symptoms initially started sometime between 6 and 7 p.m. he had been out eating at a restaurant and had come back home. His son helps provide history as he notes being initially called about his father leaning toward the left and the left-sided weakness around 6:52 PM.  Reports symptoms only lasting a few minutes and self resolving. However he was called again at 7:04 PM with report of symptoms happening again and he recommended family to call EMS at that time. By the time he arrived at his parent's house he noted that the symptoms have resolved again. Her family members stated that the patient had some slurred speech. During these episodes he was noted to be able to wiggle his toes and fingers but not able to lift his arms or legs. Patient also complained of left-sided foot swelling for last 3-4 days. Denies any worsening of shortness of breath symptoms that he normally has. He notes being very active doing yard work in [employer]. He has not had any recent trips or prolonged immobility. Questions if this is related to to the symptoms that he had earlier.  Upon entering the emergency department patient initial lab work is unremarkable.   Review of Systems: negative for the following except as otherwise noted above Constitutional: Denies fever, chills, diaphoresis, appetite change and fatigue.  HEENT: Denies photophobia, eye pain, redness, hearing loss, ear pain, congestion, sore throat, rhinorrhea, sneezing, mouth sores, trouble swallowing, neck pain, neck stiffness and tinnitus.  Respiratory: Denies SOB, DOE, cough, chest tightness, and  wheezing.  Cardiovascular: Denies chest pain, palpitations and leg swelling.  Gastrointestinal: Denies nausea, vomiting, abdominal pain, diarrhea, constipation, blood in stool and abdominal distention.  Genitourinary: Denies dysuria, urgency, frequency, hematuria, flank pain and difficulty urinating.  Musculoskeletal: Denies myalgias, back pain, joint swelling, arthralgias and gait problem.  Skin: Denies pallor, rash and wound.  Neurological: Deines dizziness Hematological: Denies adenopathy. Easy bruising, personal or family bleeding history  Psychiatric/Behavioral: Denies suicidal ideation, mood changes, confusion, nervousness, sleep disturbance and agitation       Past Medical History  Diagnosis Date  . Diverticulosis   . Colon polyps   . Esophageal stricture   . Spinal stenosis   . Hypertension   . GERD (gastroesophageal reflux disease)   . Arthritis     R knee, L shoulder   . Voice hoarseness      Past Surgical History  Procedure Laterality Date  . Appendectomy    . Knee arthroscopy Left   . Rotator cuff repair Left   . Prostate surgery    . Back surgery      x2  . Knee arthroplasty Right 06/06/2015    Procedure: RIGHT COMPUTER ASSISTED TOTAL KNEE ARTHROPLASTY;  Surgeon: Marybelle Killings, MD;  Location: [place];  Service: Orthopedics;  Laterality: Right;      Social History:  reports that he has never smoked. He has never used smokeless tobacco. He reports that he does not drink alcohol or use illicit drugs.   No Known Allergies  Family History  Problem Relation Age of Onset  . Heart disease Mother         Prior to Admission  medications   Medication Sig Start Date End Date Taking? Authorizing Provider  cetirizine (ZYRTEC) 10 MG tablet Take 10 mg by mouth daily as needed for allergies.   Yes Historical Provider, MD  Glucosamine-Chondroitin 250-200 MG TABS Take 1 tablet by mouth daily.   Yes Historical Provider, MD  lisinopril (PRINIVIL,ZESTRIL) 10 MG tablet  Take 10 mg by mouth daily as needed (systolic >828). Pt. Takes Lisinopril only if BP is 003 systolic or >   Yes Historical Provider, MD  Multiple Vitamins-Minerals (CENTRUM SILVER ADULT 50+ PO) Take 1 tablet by mouth daily.   Yes Historical Provider, MD  Omega-3 Fatty Acids (FISH OIL) 1000 MG CAPS Take 1 capsule by mouth daily.   Yes Historical Provider, MD  omeprazole (PRILOSEC OTC) 20 MG tablet Take 20 mg by mouth daily before breakfast.    Yes Historical Provider, MD  OVER THE COUNTER MEDICATION Take 1 tablet by mouth daily.   Yes Historical Provider, MD  vitamin B-12 (CYANOCOBALAMIN) 100 MCG tablet Take 50 mcg by mouth daily.   Yes Historical Provider, MD  aspirin EC 325 MG EC tablet Take 1 tablet (325 mg total) by mouth daily with breakfast. Patient not taking: Reported on 12/09/2015 06/09/15   Christian Crumbly, PA-C  HYDROcodone-acetaminophen [facility]) 10-325 MG per tablet Take 1-2 tablets by mouth every 6 (six) hours as needed for severe pain. Patient not taking: Reported on 12/09/2015 06/09/15   Christian Crumbly, PA-C     Physical Exam: Filed Vitals:   12/09/15 2015 12/09/15 2030 12/09/15 2045 12/09/15 2100  BP: 174/81 145/72  166/107  Pulse: 76 73  48  Temp:   97.3 F (36.3 C)   TempSrc:      Resp: '23 20  22  ' Height:      Weight:      SpO2: 97% 96%  94%    Constitutional: Vital signs reviewed. Patient is a well-developed and well-nourished in no acute distress and cooperative with exam. Alert and oriented x3.  Head: Normocephalic and atraumatic  Ear: TM normal bilaterally  Mouth: no erythema or exudates, MMM  Eyes: PERRL, EOMI, conjunctivae normal, No scleral icterus.  Neck: Supple, Trachea midline normal ROM, No JVD, mass, thyromegaly, or carotid bruit present.  Cardiovascular: RRR, S1 normal, S2 normal, no MRG, pulses symmetric and intact bilaterally  Pulmonary/Chest: CTAB, no wheezes, rales, or rhonchi  Abdominal: Soft. Non-tender, non-distended, bowel sounds are normal, no masses,  organomegaly, or guarding present.  GU: no CVA tenderness Musculoskeletal: No joint deformities, erythema, or stiffness, ROM full and no nontender Ext: +1 left leg swelling and no cyanosis, pulses palpable bilaterally (DP and PT)  Hematology: no cervical, inginal, or axillary adenopathy.  Neurological: A&O x3, Strenght is normal and symmetric bilaterally, cranial nerve II-XII are grossly intact, no focal motor deficit, sensory intact to light touch bilaterally.  Skin: Warm, dry and intact. No rash, cyanosis, or clubbing.  Psychiatric: Normal mood and affect. speech and behavior is normal. Judgment and thought content normal. Cognition and memory are normal.      Data Review   Micro Results No results found for this or any previous visit (from the past 240 hour(s)).  Radiology Reports No results found.   CBC  Recent Labs Lab 12/09/15 2001 12/09/15 2011  WBC 4.9  --   HGB 13.2 14.3  HCT 40.5 42.0  PLT 198  --   MCV 86.2  --   MCH 28.1  --   MCHC 32.6  --  RDW 16.5*  --   LYMPHSABS 1.2  --   MONOABS 0.5  --   EOSABS 0.4  --   BASOSABS 0.0  --     Chemistries   Recent Labs Lab 12/09/15 2001 12/09/15 2011  NA 140 140  K 4.5 4.3  CL 103 102  CO2 25  --   GLUCOSE 131* 123*  BUN 19 22*  CREATININE 1.10 1.20  CALCIUM 9.3  --   AST 53*  --   ALT 26  --   ALKPHOS 133*  --   BILITOT 0.9  --    ------------------------------------------------------------------------------------------------------------------ estimated creatinine clearance is 47.3 mL/min (by C-G formula based on Cr of 1.2). ------------------------------------------------------------------------------------------------------------------ No results for input(s): HGBA1C in the last 72 hours. ------------------------------------------------------------------------------------------------------------------ No results for input(s): CHOL, HDL, LDLCALC, TRIG, CHOLHDL, LDLDIRECT in the last 72  hours. ------------------------------------------------------------------------------------------------------------------ No results for input(s): TSH, T4TOTAL, T3FREE, THYROIDAB in the last 72 hours.  Invalid input(s): FREET3 ------------------------------------------------------------------------------------------------------------------ No results for input(s): VITAMINB12, FOLATE, FERRITIN, TIBC, IRON, RETICCTPCT in the last 72 hours.  Coagulation profile No results for input(s): INR, PROTIME in the last 168 hours.  No results for input(s): DDIMER in the last 72 hours.  Cardiac Enzymes No results for input(s): CKMB, TROPONINI, MYOGLOBIN in the last 168 hours.  Invalid input(s): CK ------------------------------------------------------------------------------------------------------------------ Invalid input(s): POCBNP   CBG: No results for input(s): GLUCAP in the last 168 hours.     EKG: Independently reviewed.  Sinus rhythm with atrial premature complexes   Assessment/Plan Active Problems: TIA (transient ischemic attack):  Patient had to episodes of left-sided weakness with resolution of symptoms prior to admission.  EKG showing sinus rhythm with premature atrial complexes. Seen in the ED ordered to have MRI for further evaluation. Patient's only risk factor at this time includes hypertension. Question if patient had TIA versus acute stroke versus other cause for transient symptoms.  MRI showed only high-grade stenosis of P3, but no other acute abnormalities. - admit for observation to telemetry bed - Checking TSH, RPR, ESR, CRP, vitamin B12 level - consulted  Dr. Nicole Kindred of neurology  - Hemoglobin A1c and Lipid panel in a.m. to evaluate risk factors - PT /OT to eval and treat - Checking echocardiogram and carotid duplex Doppler  Essential hypertension:  Previously well-controlled this patient only was on lisinopril as as-needed basis. Blood pressure on admission elevated  at 166/107. this could be secondary to acute stress  - consider changing lisinopril from prn to daily (question possibility of rebound if patient taking intermittently)  Left leg swelling -  Check stat venous duplex Doppler ultrasound of lower extremity  Hypocalcemia:  Ionized calcium mildly low at 1.14 - Replacing with 1 g of calcium gluconate now - recheck ionized calcium in a.m.    Lovenox for DVT prophylaxis  Code Status:   full Family Communication: bedside Disposition Plan: admit   Total time spent 55 minutes.Greater than 50% of this time was spent in counseling, explanation of diagnosis, planning of further management, and coordination of care  [facility] Pager [phone]  If 7PM-7AM, please contact night-coverage [email or web address] Password [email or web address] 12/09/2015, 10:30 PM

## 2015-12-09 NOTE — ED Notes (Signed)
Pt from home, had 2 episodes of unilateral left side weakness/paralysis with gaze to the left side, both episodes lasted 5 minutes each. Symptoms completely resolved now. Onset of episodes was 1905. Pt stable NAD. Alert and oriented x 4. EMS VS BP 151/89, HR 77 intermittent PACs, RR 16, 98% on RA. CBG 138.

## 2015-12-10 ENCOUNTER — Observation Stay (HOSPITAL_BASED_OUTPATIENT_CLINIC_OR_DEPARTMENT_OTHER): Payer: Medicare Other

## 2015-12-10 ENCOUNTER — Observation Stay (HOSPITAL_COMMUNITY): Payer: Medicare Other

## 2015-12-10 DIAGNOSIS — M7989 Other specified soft tissue disorders: Secondary | ICD-10-CM | POA: Diagnosis not present

## 2015-12-10 DIAGNOSIS — G459 Transient cerebral ischemic attack, unspecified: Secondary | ICD-10-CM

## 2015-12-10 DIAGNOSIS — E785 Hyperlipidemia, unspecified: Secondary | ICD-10-CM | POA: Diagnosis not present

## 2015-12-10 DIAGNOSIS — I10 Essential (primary) hypertension: Secondary | ICD-10-CM | POA: Diagnosis present

## 2015-12-10 LAB — BASIC METABOLIC PANEL
Anion gap: 13 (ref 5–15)
BUN: 20 mg/dL (ref 6–20)
CALCIUM: 9.3 mg/dL (ref 8.9–10.3)
CHLORIDE: 104 mmol/L (ref 101–111)
CO2: 23 mmol/L (ref 22–32)
CREATININE: 1.09 mg/dL (ref 0.61–1.24)
GFR calc non Af Amer: >60 mL/min (ref >60)
Glucose, Bld: 112 mg/dL — ABNORMAL HIGH (ref 65–99)
Potassium: 4.1 mmol/L (ref 3.5–5.1)
SODIUM: 140 mmol/L (ref 135–145)

## 2015-12-10 LAB — GLUCOSE, CAPILLARY: Glucose-Capillary: 102 mg/dL — ABNORMAL HIGH (ref 65–99)

## 2015-12-10 LAB — MAGNESIUM: MAGNESIUM: 1.8 mg/dL (ref 1.7–2.4)

## 2015-12-10 LAB — LIPID PANEL
CHOLESTEROL: 176 mg/dL (ref 0–200)
HDL: 71 mg/dL (ref >40)
LDL Cholesterol: 97 mg/dL (ref 0–99)
TRIGLYCERIDES: 39 mg/dL (ref <150)
Total CHOL/HDL Ratio: 2.5 RATIO
VLDL: 8 mg/dL (ref 0–40)

## 2015-12-10 LAB — VITAMIN B12: Vitamin B-12: 1223 pg/mL — ABNORMAL HIGH (ref 180–914)

## 2015-12-10 LAB — HIV ANTIBODY (ROUTINE TESTING W REFLEX): HIV SCREEN 4TH GENERATION: NONREACTIVE

## 2015-12-10 LAB — RPR: RPR Ser Ql: NONREACTIVE

## 2015-12-10 LAB — TSH: TSH: 2.383 u[IU]/mL (ref 0.350–4.500)

## 2015-12-10 LAB — C-REACTIVE PROTEIN

## 2015-12-10 LAB — SEDIMENTATION RATE: SED RATE: 1 mm/h (ref 0–16)

## 2015-12-10 MED ORDER — ATORVASTATIN CALCIUM 10 MG PO TABS
20.0000 mg | ORAL_TABLET | Freq: Every day | ORAL | Status: DC
  Administered 2015-12-10: 20 mg via ORAL
  Filled 2015-12-10: qty 2

## 2015-12-10 MED ORDER — LISINOPRIL 10 MG PO TABS: 10.0000 mg | ORAL_TABLET | Freq: Every day | ORAL | Status: DC | PRN

## 2015-12-10 MED ORDER — LISINOPRIL 10 MG PO TABS
10.0000 mg | ORAL_TABLET | Freq: Every day | ORAL | Status: DC
  Administered 2015-12-10 – 2015-12-11 (×2): 10 mg via ORAL
  Filled 2015-12-10 (×2): qty 1

## 2015-12-10 NOTE — Evaluation (Signed)
Occupational Therapy Evaluation Patient Details Name: Christian [name] MRN: [id number] DOB: 1929/05/08 Today's Date: 12/10/2015    History of Present Illness Pt is an 82 y.o. male with a history of hypertension, diverticulosis and arthritis, brought to the emergency room following 2 episodes of left-sided weakness, slurred speech and facial droop, the last of which occurred at 7:05 PM on 12/09/2015. MRI of his brain showed no acute intracranial abnormality.    Clinical Impression   Pt reports he was independent with ADLs and mobility PTA. Currently pt overall supervision for safety with ADLs and functional mobility. Pt reports he feels he has returned to baseline level of functioning with no residual weakness or sensory deficits on L side. Pt planning to d/c home with 24/7 supervision from family. Pt with no further acute OT needs; signing off at this time. Thank you for this referral.     Follow Up Recommendations  No OT follow up;Supervision/Assistance - 24 hour    Equipment Recommendations  None recommended by OT    Recommendations for Other Services PT consult     Precautions / Restrictions Precautions Precautions: None Restrictions Weight Bearing Restrictions: No      Mobility Bed Mobility Overal bed mobility: Modified Independent                Transfers Overall transfer level: Modified independent Equipment used: None                  Balance Overall balance assessment: Needs assistance Sitting-balance support: Feet supported;No upper extremity supported Sitting balance-Leahy Scale: Normal     Standing balance support: No upper extremity supported;During functional activity Standing balance-Leahy Scale: Good                              ADL Overall ADL's : At baseline                                       General ADL Comments: Pts daughter present for OT eval. Pt reports he feels he has returned to his baseline  level of functioning with all weakness/sensory symptoms resolved. Pt currenlty supervision for saftey with functional mobility and ADLs. Discussed home safety and safety with ADLs upon return home.      Vision Vision Assessment?: No apparent visual deficits   Perception     Praxis      Pertinent Vitals/Pain Pain Assessment: No/denies pain     Hand Dominance Right   Extremity/Trunk Assessment Upper Extremity Assessment Upper Extremity Assessment: Overall WFL for tasks assessed   Lower Extremity Assessment Lower Extremity Assessment: Defer to PT evaluation   Cervical / Trunk Assessment Cervical / Trunk Assessment: Kyphotic   Communication Communication Communication: No difficulties   Cognition Arousal/Alertness: Awake/alert Behavior During Therapy: WFL for tasks assessed/performed Overall Cognitive Status: Within Functional Limits for tasks assessed                     General Comments       Exercises       Shoulder Instructions      Home Living Family/patient expects to be discharged to:: Private residence Living Arrangements: Spouse/significant other;Children Available Help at Discharge: Family;Available 24 hours/day Type of Home: House ("Condo")       Home Layout: Two level;Able to live on main level with bedroom/bathroom  Bathroom Shower/Tub: Psychologist, counselling;Door   [employer] Toilet: Handicapped height Bathroom Accessibility: Yes How Accessible: Accessible via walker Home Equipment: Walker - 2 wheels;Walker - standard;Walker - 4 wheels;Bedside commode;Shower seat - built in;Grab bars - tub/shower          Prior Functioning/Environment Level of Independence: Independent             OT Diagnosis: Generalized weakness   OT Problem List:     OT Treatment/Interventions:      OT Goals(Current goals can be found in the care plan section) Acute Rehab OT Goals Patient Stated Goal: return home OT Goal Formulation: All assessment and  education complete, DC therapy  OT Frequency:     Barriers to D/C:            Co-evaluation              End of Session Equipment Utilized During Treatment: Gait belt Nurse Communication: Mobility status  Activity Tolerance: Patient tolerated treatment well Patient left: in bed;with call bell/phone within reach;with family/visitor present   Time: 1217-1233 OT Time Calculation (min): 16 min Charges:  OT General Charges $OT Visit: 1 Procedure OT Evaluation $OT Eval Low Complexity: 1 Procedure G-Codes: OT G-codes **NOT FOR INPATIENT CLASS** Functional Assessment Tool Used: Clinical judgement Functional Limitation: Self care Self Care Current Status (V4098): At least 1 percent but less than 20 percent impaired, limited or restricted Self Care Goal Status (J1914): At least 1 percent but less than 20 percent impaired, limited or restricted Self Care Discharge Status [phone]): At least 1 percent but less than 20 percent impaired, limited or restricted   Gaye Alken M.S., OTR/L Pager: [phone]  12/10/2015, 1:06 PM

## 2015-12-10 NOTE — Progress Notes (Signed)
VASCULAR LAB PRELIMINARY  PRELIMINARY  PRELIMINARY  PRELIMINARY  Left lower extremity venous duplex completed.    Preliminary report:  Left:  No evidence of DVT, superficial thrombosis, or Baker's cyst.  Kalman Nylen, RVS 12/10/2015, 8:52 AM

## 2015-12-10 NOTE — Progress Notes (Signed)
VASCULAR LAB PRELIMINARY  PRELIMINARY  PRELIMINARY  PRELIMINARY  Carotid duplex completed.    Preliminary report:  Bilateral:  1-39% ICA stenosis.  Vertebral artery flow is antegrade.     Prentiss Polio, RVS 12/10/2015, 8:53 AM

## 2015-12-10 NOTE — Evaluation (Signed)
Physical Therapy Evaluation & discharge Patient Details Name: Christian [name] MRN: [id number] DOB: 1928/11/15 Today's Date: 12/10/2015   History of Present Illness  Pt is an 82 y.o. male with a history of hypertension, diverticulosis and arthritis, brought to the emergency room following 2 episodes of left-sided weakness, slurred speech and facial droop, the last of which occurred at 7:05 PM on 12/09/2015. MRI of his brain showed no acute intracranial abnormality.   Clinical Impression  Pt at MOD I/S level with all mobility including stairs and feels he back to baseline.  No further PT needs identified and will sign off.  If pt's status changes, please re-order.    Follow Up Recommendations No PT follow up    Equipment Recommendations  None recommended by PT    Recommendations for Other Services       Precautions / Restrictions Precautions Precautions: None Restrictions Weight Bearing Restrictions: No      Mobility  Bed Mobility Overal bed mobility: Modified Independent                Transfers Overall transfer level: Modified independent Equipment used: None                Ambulation/Gait Ambulation/Gait assistance: Supervision;Modified independent (Device/Increase time) Ambulation Distance (Feet): 550 Feet Assistive device: None Gait Pattern/deviations: Step-through pattern Gait velocity: WNL Gait velocity interpretation: at or above normal speed for age/gender General Gait Details: Pt able to open doors, do head nods, head turns, quick stops with no LOB  Stairs Stairs: Yes Stairs assistance: Supervision Stair Management: One rail Left;Alternating pattern Number of Stairs: 10 General stair comments: close S only as a precaution due to pt doing whole flight  Wheelchair Mobility    Modified Rankin (Stroke Patients Only) Modified Rankin (Stroke Patients Only) Pre-Morbid Rankin Score: No symptoms Modified Rankin: No symptoms     Balance Overall  balance assessment: Needs assistance Sitting-balance support: Feet supported;No upper extremity supported Sitting balance-Leahy Scale: Normal     Standing balance support: No upper extremity supported;During functional activity Standing balance-Leahy Scale: Good                               Pertinent Vitals/Pain Pain Assessment: No/denies pain    Home Living Family/patient expects to be discharged to:: Private residence Living Arrangements: Spouse/significant other;Children Available Help at Discharge: Family;Available 24 hours/day Type of Home: House ("Condo") Home Access: Stairs to enter Entrance Stairs-Rails: Right;Left;Can reach both Entrance Stairs-Number of Steps: 5 Home Layout: Two level;Able to live on main level with bedroom/bathroom Home Equipment: Dan Humphreys - 2 wheels;Walker - standard;Walker - 4 wheels;Bedside commode;Shower seat - built in;Grab bars - tub/shower      Prior Function Level of Independence: Independent               Hand Dominance   Dominant Hand: Right    Extremity/Trunk Assessment   Upper Extremity Assessment: Defer to OT evaluation           Lower Extremity Assessment: Overall WFL for tasks assessed      Cervical / Trunk Assessment: Kyphotic  Communication   Communication: No difficulties  Cognition Arousal/Alertness: Awake/alert Behavior During Therapy: WFL for tasks assessed/performed Overall Cognitive Status: Within Functional Limits for tasks assessed                      General Comments General comments (skin integrity, edema, etc.): Daughter present  Exercises        Assessment/Plan    PT Assessment Patent does not need any further PT services  PT Diagnosis Difficulty walking   PT Problem List    PT Treatment Interventions     PT Goals (Current goals can be found in the Care Plan section) Acute Rehab PT Goals Patient Stated Goal: return home PT Goal Formulation: All assessment and  education complete, DC therapy    Frequency     Barriers to discharge        Co-evaluation               End of Session Equipment Utilized During Treatment: Gait belt Activity Tolerance: Patient tolerated treatment well Patient left: in bed;with call bell/phone within reach;with family/visitor present (sitting EOB) Nurse Communication: Mobility status    Functional Assessment Tool Used: clinical judgement and objective findings Functional Limitation: Mobility: Walking and moving around Mobility: Walking and Moving Around Current Status [phone]): 0 percent impaired, limited or restricted Mobility: Walking and Moving Around Goal Status [phone]): 0 percent impaired, limited or restricted Mobility: Walking and Moving Around Discharge Status [phone]): 0 percent impaired, limited or restricted    Time: 1413-1430 PT Time Calculation (min) (ACUTE ONLY): 17 min   Charges:   PT Evaluation $PT Eval Low Complexity: 1 Procedure     PT G Codes:   PT G-Codes **NOT FOR INPATIENT CLASS** Functional Assessment Tool Used: clinical judgement and objective findings Functional Limitation: Mobility: Walking and moving around Mobility: Walking and Moving Around Current Status (B1478): 0 percent impaired, limited or restricted Mobility: Walking and Moving Around Goal Status (G9562): 0 percent impaired, limited or restricted Mobility: Walking and Moving Around Discharge Status (Z3086): 0 percent impaired, limited or restricted    Christian [name] 12/10/2015, 2:58 PM

## 2015-12-10 NOTE — Progress Notes (Signed)
[facility] PROGRESS NOTE  Christian [name] WUJ:[id number] DOB: 1929/01/23 DOA: 12/09/2015 PCP: Minda Meo, MD  Assessment/Plan: 1. TIA- patient admitted for TIA workup, MRI brain is negative for stroke. Carotid duplex and echocardiogram is pending. LDL cholesterol is 97. Prophylactically started on aspirin. 2. Hypertension- patient was on lisinopril 10 mg daily prn, will change to scheduled lisinopril 10 mg po daily. 3. Hypocalcemia- ionized calcium is 1.14, replaced with 1 gm calcium gluconate last night. 4. DVT prophylaxis- Lovenox.  Code Status: Full code Family Communication: Discussed with patient's son at bedside Disposition Plan: Pending workup for TIA   Consultants:  Neurology  Procedures:  Carotid duplex  Echocardiogram  Lower extremity venous duplex  Antibiotics:  None  HPI/Subjective: 82 year old male with a past medical history significant for HTN; who presents with complaints of 2 episodes of left-sided weakness today. Symptoms initially started sometime between 6 and 7 p.m. he had been out eating at a restaurant and had come back home. His son helps provide history as he notes being initially called about his father leaning toward the left and the left-sided weakness around 6:52 PM. Reports symptoms only lasting a few minutes and self resolving. However he was called again at 7:04 PM with report of symptoms happening again and he recommended family to call EMS at that time. By the time he arrived at his parent's house he noted that the symptoms have resolved again. Her family members stated that the patient had some slurred speech. During these episodes he was noted to be able to wiggle his toes and fingers but not able to lift his arms or legs. Patient also complained of left-sided foot swelling for last 3-4 days.  Patient underwent lower extremity venous duplex this morning, which was negative for DVT.    Objective: Filed Vitals:   12/10/15 1055  12/10/15 1255  BP: 165/91 164/91  Pulse: 73 82  Temp: 97.9 F (36.6 C)   Resp:  18   No intake or output data in the 24 hours ending 12/10/15 1408 Filed Weights   12/09/15 1958  Weight: 75.07 kg (165 lb 8 oz)    Exam:   General:  Appears in no acute distress  Cardiovascular: S1S2 RRR  Respiratory: Clear bilaterally  Abdomen: Soft, nontender, no organomegaly  Musculoskeletal: no cyanosis/clubbing/edema of lower extremities   Data Reviewed: Basic Metabolic Panel:  Recent Labs Lab 12/09/15 2001 12/09/15 2011 12/09/15 2346 12/10/15 0624  NA 140 140 140  --   K 4.5 4.3 4.1  --   CL 103 102 104  --   CO2 25  --  23  --   GLUCOSE 131* 123* 112*  --   BUN 19 22* 20  --   CREATININE 1.10 1.20 1.09  --   CALCIUM 9.3  --  9.3  --   MG  --   --   --  1.8   Liver Function Tests:  Recent Labs Lab 12/09/15 2001  AST 53*  ALT 26  ALKPHOS 133*  BILITOT 0.9  PROT 5.6*  ALBUMIN 3.7     Recent Labs Lab 12/09/15 2001 12/09/15 2011  WBC 4.9  --   NEUTROABS 2.8  --   HGB 13.2 14.3  HCT 40.5 42.0  MCV 86.2  --   PLT 198  --    CBG:  Recent Labs Lab 12/10/15 0226  GLUCAP 102*      Studies: Dg Chest 2 View  12/09/2015  CLINICAL DATA:  TIA. Episodes of  left-sided weakness lasting 5 minutes. EXAM: CHEST  2 VIEW COMPARISON:  05/26/2015 FINDINGS: Again seen elevation of right hemidiaphragm, minimally increased in degree from prior. Adjacent atelectasis or scarring at the right lung base. Heart at the upper limits normal in size, mediastinal contours are unchanged. No pulmonary edema, pleural effusion, pneumothorax or focal consolidation. Mild degenerative change in the spine. IMPRESSION: Elevation of right hemidiaphragm, with minimal progression from prior. Otherwise no acute process. Electronically Signed   By: Rubye Oaks M.D.   On: 12/09/2015 23:05   Mr Brain Wo Contrast  12/09/2015  CLINICAL DATA:  Two 5 minutes episodes of unilateral LEFT-sided  weakness today, spontaneous resolution. History of hypertension. EXAM: MRI HEAD WITHOUT CONTRAST MRA HEAD WITHOUT CONTRAST TECHNIQUE: Multiplanar, multiecho pulse sequences of the brain and surrounding structures were obtained without intravenous contrast. Angiographic images of the head were obtained using MRA technique without contrast. COMPARISON:  None. FINDINGS: MRI HEAD FINDINGS The ventricles and sulci are normal for patient's age. No abnormal parenchymal signal, mass lesions, mass effect. No reduced diffusion to suggest acute ischemia. LEFT inferior basal ganglia small perivascular space. Patchy to confluent supratentorial and pontine white matter FLAIR T2 hyperintensities. Small area LEFT posterior temporal lobe susceptibility artifact without mass-effect or surrounding edema. No abnormal extra-axial fluid collections. No extra-axial masses though, contrast enhanced sequences would be more sensitive. Normal major intracranial vascular flow voids seen at the skull base. Ocular globes and orbital contents are unremarkable though not tailored for evaluation. No abnormal sellar expansion. No suspicious calvarial bone marrow signal. Craniocervical junction maintained. Visualized paranasal sinuses and mastoid air cells are well-aerated. MRA HEAD FINDINGS Anterior circulation: Normal flow related enhancement of the included cervical, petrous, cavernous and supraclinoid internal carotid arteries. Patent anterior communicating artery. Normal flow related enhancement of the anterior and middle cerebral arteries, including distal segments. No large vessel occlusion, high-grade stenosis, abnormal luminal irregularity, aneurysm. Posterior circulation: RIGHT vertebral artery is dominant. Basilar artery is patent, with normal flow related enhancement of the main branch vessels. Flow related enhancement of the posterior cerebral arteries. High-grade stenosis LEFT P3 segment. No large vessel occlusion, abnormal luminal  irregularity, aneurysm. IMPRESSION: MRI HEAD: No acute intracranial process, specifically no acute ischemia. Involutional changes and moderate chronic small vessel ischemic disease. Susceptibility artifact LEFT posterior temporal lobe may represent old hemorrhage or cavernoma without acute component. MRA HEAD: No acute large vessel occlusion. High-grade stenosis LEFT P3 segment. Electronically Signed   By: Awilda Metro M.D.   On: 12/09/2015 23:29   Mr [name] Head/brain Wo Cm  12/09/2015  CLINICAL DATA:  Two 5 minutes episodes of unilateral LEFT-sided weakness today, spontaneous resolution. History of hypertension. EXAM: MRI HEAD WITHOUT CONTRAST MRA HEAD WITHOUT CONTRAST TECHNIQUE: Multiplanar, multiecho pulse sequences of the brain and surrounding structures were obtained without intravenous contrast. Angiographic images of the head were obtained using MRA technique without contrast. COMPARISON:  None. FINDINGS: MRI HEAD FINDINGS The ventricles and sulci are normal for patient's age. No abnormal parenchymal signal, mass lesions, mass effect. No reduced diffusion to suggest acute ischemia. LEFT inferior basal ganglia small perivascular space. Patchy to confluent supratentorial and pontine white matter FLAIR T2 hyperintensities. Small area LEFT posterior temporal lobe susceptibility artifact without mass-effect or surrounding edema. No abnormal extra-axial fluid collections. No extra-axial masses though, contrast enhanced sequences would be more sensitive. Normal major intracranial vascular flow voids seen at the skull base. Ocular globes and orbital contents are unremarkable though not tailored for evaluation. No abnormal sellar expansion. No suspicious  calvarial bone marrow signal. Craniocervical junction maintained. Visualized paranasal sinuses and mastoid air cells are well-aerated. MRA HEAD FINDINGS Anterior circulation: Normal flow related enhancement of the included cervical, petrous, cavernous and  supraclinoid internal carotid arteries. Patent anterior communicating artery. Normal flow related enhancement of the anterior and middle cerebral arteries, including distal segments. No large vessel occlusion, high-grade stenosis, abnormal luminal irregularity, aneurysm. Posterior circulation: RIGHT vertebral artery is dominant. Basilar artery is patent, with normal flow related enhancement of the main branch vessels. Flow related enhancement of the posterior cerebral arteries. High-grade stenosis LEFT P3 segment. No large vessel occlusion, abnormal luminal irregularity, aneurysm. IMPRESSION: MRI HEAD: No acute intracranial process, specifically no acute ischemia. Involutional changes and moderate chronic small vessel ischemic disease. Susceptibility artifact LEFT posterior temporal lobe may represent old hemorrhage or cavernoma without acute component. MRA HEAD: No acute large vessel occlusion. High-grade stenosis LEFT P3 segment. Electronically Signed   By: Awilda Metro M.D.   On: 12/09/2015 23:29    Scheduled Meds: . aspirin EC  325 mg Oral Q breakfast  . enoxaparin (LOVENOX) injection  40 mg Subcutaneous Q24H  . loratadine  10 mg Oral Daily  . multivitamin with minerals  1 tablet Oral Daily  . pantoprazole  40 mg Oral QAC breakfast  . senna-docusate  1 tablet Oral QHS  . vitamin B-12  50 mcg Oral Daily   Continuous Infusions:   Principal Problem:   TIA (transient ischemic attack) Active Problems:   Benign essential HTN   Left leg swelling   Hypocalcemia    Time spent: 25 min    [facility]  [facility] Pager [phone]. If 7PM-7AM, please contact night-coverage at [email or web address], password [facility] 12/10/2015, 2:08 PM

## 2015-12-10 NOTE — Progress Notes (Signed)
STROKE TEAM PROGRESS NOTE   HISTORY OF PRESENT ILLNESS Christian [name] is an 82 y.o. male with a history of hypertension, diverticulosis and arthritis, brought to the emergency room following 2 episodes of left-sided weakness, slurred speech and facial droop, the last of which occurred at 7:05 PM on 12/09/2015. He has no previous history of stroke nor TIA. He's been taking aspirin 325 mg per day. MRI of his brain showed no acute intracranial abnormality. MRA was unremarkable except for a left P3 high-grade stenosis. Patient's deficits resolved without treatment intervention. NIH stroke score the time of this evaluation was 0.  LSN: 7:05 PM on 12/09/2015 tPA Given: No: Deficits resolved mRankin:   SUBJECTIVE (INTERVAL HISTORY) His daughter was at the bedside.  Overall he feels his condition is improved.  No complaints for ROS   OBJECTIVE Temp:  [97.3 F (36.3 C)-98.2 F (36.8 C)] 98.1 F (36.7 C) (02/25 0541) Pulse Rate:  [48-90] 61 (02/25 0541) Cardiac Rhythm:  [-] Normal sinus rhythm (02/25 0541) Resp:  [13-24] 20 (02/25 0541) BP: (145-174)/(72-107) 145/85 mmHg (02/25 0541) SpO2:  [94 %-98 %] 96 % (02/25 0541) Weight:  [75.07 kg (165 lb 8 oz)] 75.07 kg (165 lb 8 oz) (02/24 1958)  CBC:  Recent Labs Lab 12/09/15 2001 12/09/15 2011  WBC 4.9  --   NEUTROABS 2.8  --   HGB 13.2 14.3  HCT 40.5 42.0  MCV 86.2  --   PLT 198  --     Basic Metabolic Panel:  Recent Labs Lab 12/09/15 2001 12/09/15 2011 12/09/15 2346 12/10/15 0624  NA 140 140 140  --   K 4.5 4.3 4.1  --   CL 103 102 104  --   CO2 25  --  23  --   GLUCOSE 131* 123* 112*  --   BUN 19 22* 20  --   CREATININE 1.10 1.20 1.09  --   CALCIUM 9.3  --  9.3  --   MG  --   --   --  1.8    Lipid Panel:    Component Value Date/Time   CHOL 176 12/10/2015 0624   TRIG 39 12/10/2015 0624   HDL 71 12/10/2015 0624   CHOLHDL 2.5 12/10/2015 0624   VLDL 8 12/10/2015 0624   LDLCALC 97 12/10/2015 0624   HgbA1c: No  results found for: HGBA1C Urine Drug Screen:    Component Value Date/Time   LABOPIA NONE DETECTED 12/09/2015 2049   COCAINSCRNUR NONE DETECTED 12/09/2015 2049   LABBENZ NONE DETECTED 12/09/2015 2049   AMPHETMU NONE DETECTED 12/09/2015 2049   THCU NONE DETECTED 12/09/2015 2049   LABBARB NONE DETECTED 12/09/2015 2049      IMAGING  Dg Chest 2 View 12/09/2015   Elevation of right hemidiaphragm, with minimal progression from prior. Otherwise no acute process.     Mr Christian [name] Head/brain Wo Cm 12/09/2015    MRI HEAD:  No acute intracranial process, specifically no acute ischemia. Involutional changes and moderate chronic small vessel ischemic disease. Susceptibility artifact LEFT posterior temporal lobe may represent old hemorrhage or cavernoma without acute component.   MRA HEAD:  No acute large vessel occlusion. High-grade stenosis LEFT P3 segment.    PHYSICAL EXAM HEENT- Normocephalic, no lesions, without obvious abnormality. Normal external eye and conjunctiva.Normal external nose, mucus membranes and septum. Normal pharynx. Neck supple with no masses, nodes, nodules or enlargement. Cardiovascular - regular rate and rhythm, S1, S2 normal, no murmur, click, rub or gallop Lungs - chest  clear, no wheezing, rales, normal symmetric air entry Abdomen - soft, non-tender; bowel sounds normal; no masses, no organomegaly Extremities - no joint deformities, effusion, or inflammation and no edema  Neurologic Examination: Mental Status: Alert, oriented, thought content appropriate. Speech fluent without evidence of aphasia. Able to follow commands without difficulty. Cranial Nerves: II-Visual fields were normal. III/IV/VI-Pupils were equal and reacted normally to light. Extraocular movements were full and conjugate.  V/VII-no facial numbness and no facial weakness. VIII-normal. X-low volume speech without dysarthria. XI: trapezius strength/neck flexion strength normal  bilaterally XII-midline tongue extension with normal strength. Motor: 5/5 bilaterally with normal tone and bulk Sensory: Normal throughout. Deep Tendon Reflexes: 1+ and symmetric in upper extremities and absent in lower extremities. Plantars: Mute bilaterally Cerebellar: Normal finger-to-nose testing.  ASSESSMENT/PLAN Christian [name] is a 82 y.o. male with history of hypertension, diverticulosis and arthritis,  presenting with 2 episodes of left-sided weakness, slurred speech and facial droop, . He did not receive IV t-PA due to resolution of deficits.  TIA:  Non-dominant   Resultant  resolution of deficits  MRI  no acute findings  MRA  High-grade stenosis LEFT P3 segment.   Carotid Doppler Bilateral: 1-39% ICA stenosis. Vertebral artery flow is antegrade.   2D Echo pending  Left Lower extremity venous Dopplers - Left: No evidence of DVT, superficial thrombosis, or Baker's cyst.  LDL - 97  HgbA1c pending  VTE prophylaxis - Lovenox  Diet Heart Room service appropriate?: Yes; Fluid consistency:: Thin  aspirin 325 mg daily prior to admission, now on aspirin 325 mg daily consider change to Plavix  Patient counseled to be compliant with his antithrombotic medications  Ongoing aggressive stroke risk factor management  Therapy recommendations: No follow-up physical therapy  Disposition: Pending  Hypertension  Mildly high blood pressures   Hyperlipidemia  Home meds: No lipid lowering medications prior to admission  LDL 97, goal < 70  Lipitor 20 mg daily started  Continue statin at discharge    Other Stroke Risk Factors  Advanced age   Other Active Problems  High-grade stenosis LEFT P3 segment.   Hospital day #   Delton See PA-C [facility] Pager [phone] 12/10/2015, 3:30 PM  ATTENDING NOTE: Patient was seen and examined by me personally. Documentation reflects findings. The laboratory and radiographic studies  reviewed by me. ROS completed by me personally and pertinent positives fully documented  Condition: STABLE  Assessment and plan completed by me personally and fully documented above. Plans/Recommendations include:     Stroke work=up underway  Will follow SIGNED BY: Dr. Sula Soda     To contact Stroke Continuity provider, please refer to [email or web address]. After hours, contact General Neurology

## 2015-12-10 NOTE — Consult Note (Signed)
Admission H&P    Chief Complaint: Left-sided weakness and slurred speech.  HPI: Christian [name] is an 82 y.o. male with a history of hypertension, diverticulosis and arthritis, brought to the emergency room following 2 episodes of left-sided weakness, slurred speech and facial droop, the last of which occurred at 7:05 PM on 12/09/2015. He has no previous history of stroke nor TIA. He's been taking aspirin 325 mg per day. MRI of his brain showed no acute intracranial abnormality. MRA was unremarkable except for a left P3 high-grade stenosis. Patient's deficits resolved without treatment intervention. NIH stroke score the time of this evaluation was 0.  LSN: 7:05 PM on 12/09/2015 tPA Given: No: Deficits resolved mRankin:  Past Medical History  Diagnosis Date  . Diverticulosis   . Colon polyps   . Esophageal stricture   . Spinal stenosis   . Hypertension   . GERD (gastroesophageal reflux disease)   . Arthritis     R knee, L shoulder   . Voice hoarseness     Past Surgical History  Procedure Laterality Date  . Appendectomy    . Knee arthroscopy Left   . Rotator cuff repair Left   . Prostate surgery    . Back surgery      x2  . Knee arthroplasty Right 06/06/2015    Procedure: RIGHT COMPUTER ASSISTED TOTAL KNEE ARTHROPLASTY;  Surgeon: Marybelle Killings, MD;  Location: [facility];  Service: Orthopedics;  Laterality: Right;    Family History  Problem Relation Age of Onset  . Heart disease Mother    Social History:  reports that he has never smoked. He has never used smokeless tobacco. He reports that he does not drink alcohol or use illicit drugs.  Allergies: No Known Allergies  Medications Prior to Admission  Medication Sig Dispense Refill  . cetirizine (ZYRTEC) 10 MG tablet Take 10 mg by mouth daily as needed for allergies.    . Glucosamine-Chondroitin 250-200 MG TABS Take 1 tablet by mouth daily.    Marland Kitchen lisinopril (PRINIVIL,ZESTRIL) 10 MG tablet Take 10 mg by mouth daily as needed  (systolic >749). Pt. Takes Lisinopril only if BP is 449 systolic or >    . Multiple Vitamins-Minerals (CENTRUM SILVER ADULT 50+ PO) Take 1 tablet by mouth daily.    . Omega-3 Fatty Acids (FISH OIL) 1000 MG CAPS Take 1 capsule by mouth daily.    Marland Kitchen omeprazole (PRILOSEC OTC) 20 MG tablet Take 20 mg by mouth daily before breakfast.     . OVER THE COUNTER MEDICATION Take 1 tablet by mouth daily.    . vitamin B-12 (CYANOCOBALAMIN) 100 MCG tablet Take 50 mcg by mouth daily.    Marland Kitchen aspirin EC 325 MG EC tablet Take 1 tablet (325 mg total) by mouth daily with breakfast. (Patient not taking: Reported on 12/09/2015) 30 tablet 0  . HYDROcodone-acetaminophen (NORCO) 10-325 MG per tablet Take 1-2 tablets by mouth every 6 (six) hours as needed for severe pain. (Patient not taking: Reported on 12/09/2015) 60 tablet 0    ROS: History obtained from the patient  General ROS: negative for - chills, fatigue, fever, night sweats, weight gain or weight loss Psychological ROS: negative for - behavioral disorder, hallucinations, memory difficulties, mood swings or suicidal ideation Ophthalmic ROS: negative for - blurry vision, double vision, eye pain or loss of vision ENT ROS: negative for - epistaxis, nasal discharge, oral lesions, sore throat, tinnitus or vertigo Allergy and Immunology ROS: negative for - hives or itchy/watery eyes Hematological  and Lymphatic ROS: negative for - bleeding problems, bruising or swollen lymph nodes Endocrine ROS: negative for - galactorrhea, hair pattern changes, polydipsia/polyuria or temperature intolerance Respiratory ROS: negative for - cough, hemoptysis, shortness of breath or wheezing Cardiovascular ROS: negative for - chest pain, dyspnea on exertion, edema or irregular heartbeat Gastrointestinal ROS: negative for - abdominal pain, diarrhea, hematemesis, nausea/vomiting or stool incontinence Genito-Urinary ROS: negative for - dysuria, hematuria, incontinence or urinary  frequency/urgency Musculoskeletal ROS: negative for - joint swelling or muscular weakness Neurological ROS: as noted in HPI Dermatological ROS: negative for rash and skin lesion changes  Physical Examination: Blood pressure 165/76, pulse 63, temperature 98.2 F (36.8 C), temperature source Oral, resp. rate 20, height '5\' 10"'  (1.778 m), weight 75.07 kg (165 lb 8 oz), SpO2 95 %.  HEENT-  Normocephalic, no lesions, without obvious abnormality.  Normal external eye and conjunctiva.  Normal TM's bilaterally.  Normal auditory canals and external ears. Normal external nose, mucus membranes and septum.  Normal pharynx. Neck supple with no masses, nodes, nodules or enlargement. Cardiovascular - regular rate and rhythm, S1, S2 normal, no murmur, click, rub or gallop Lungs - chest clear, no wheezing, rales, normal symmetric air entry Abdomen - soft, non-tender; bowel sounds normal; no masses,  no organomegaly Extremities - no joint deformities, effusion, or inflammation and no edema  Neurologic Examination: Mental Status: Alert, oriented, thought content appropriate.  Speech fluent without evidence of aphasia. Able to follow commands without difficulty. Cranial Nerves: II-Visual fields were normal. III/IV/VI-Pupils were equal and reacted normally to light. Extraocular movements were full and conjugate.    V/VII-no facial numbness and no facial weakness. VIII-normal. X-low volume speech without dysarthria. XI: trapezius strength/neck flexion strength normal bilaterally XII-midline tongue extension with normal strength. Motor: 5/5 bilaterally with normal tone and bulk Sensory: Normal throughout. Deep Tendon Reflexes: 1+ and symmetric in upper extremities and absent in lower extremities. Plantars: Mute bilaterally Cerebellar: Normal finger-to-nose testing. Carotid auscultation: Normal  Results for orders placed or performed during the hospital encounter of 12/09/15 (from the past 48 hour(s))  CBC      Status: Abnormal   Collection Time: 12/09/15  8:01 PM  Result Value Ref Range   WBC 4.9 4.0 - 10.5 K/uL   RBC 4.70 4.22 - 5.81 MIL/uL   Hemoglobin 13.2 13.0 - 17.0 g/dL   HCT 40.5 39.0 - 52.0 %   MCV 86.2 78.0 - 100.0 fL   MCH 28.1 26.0 - 34.0 pg   MCHC 32.6 30.0 - 36.0 g/dL   RDW 16.5 (H) 11.5 - 15.5 %   Platelets 198 150 - 400 K/uL  Differential     Status: None   Collection Time: 12/09/15  8:01 PM  Result Value Ref Range   Neutrophils Relative % 55 %   Neutro Abs 2.8 1.7 - 7.7 K/uL   Lymphocytes Relative 25 %   Lymphs Abs 1.2 0.7 - 4.0 K/uL   Monocytes Relative 11 %   Monocytes Absolute 0.5 0.1 - 1.0 K/uL   Eosinophils Relative 8 %   Eosinophils Absolute 0.4 0.0 - 0.7 K/uL   Basophils Relative 1 %   Basophils Absolute 0.0 0.0 - 0.1 K/uL  Comprehensive metabolic panel     Status: Abnormal   Collection Time: 12/09/15  8:01 PM  Result Value Ref Range   Sodium 140 135 - 145 mmol/L   Potassium 4.5 3.5 - 5.1 mmol/L   Chloride 103 101 - 111 mmol/L   CO2 25 22 -  32 mmol/L   Glucose, Bld 131 (H) 65 - 99 mg/dL   BUN 19 6 - 20 mg/dL   Creatinine, Ser 1.10 0.61 - 1.24 mg/dL   Calcium 9.3 8.9 - 10.3 mg/dL   Total Protein 5.6 (L) 6.5 - 8.1 g/dL   Albumin 3.7 3.5 - 5.0 g/dL   AST 53 (H) 15 - 41 U/L   ALT 26 17 - 63 U/L   Alkaline Phosphatase 133 (H) 38 - 126 U/L   Total Bilirubin 0.9 0.3 - 1.2 mg/dL   GFR calc non Af Amer 60 (L) >60 mL/min   GFR calc Af Amer >60 >60 mL/min    Comment: (NOTE) The eGFR has been calculated using the CKD EPI equation. This calculation has not been validated in all clinical situations. eGFR's persistently <60 mL/min signify possible Chronic Kidney Disease.    Anion gap 12 5 - 15  I-stat troponin, ED (not at [facility], [facility])     Status: None   Collection Time: 12/09/15  8:09 PM  Result Value Ref Range   Troponin i, poc 0.04 0.00 - 0.08 ng/mL   Comment 3            Comment: Due to the release kinetics of cTnI, a negative result within the first  hours of the onset of symptoms does not rule out myocardial infarction with certainty. If myocardial infarction is still suspected, repeat the test at appropriate intervals.   I-Stat Chem 8, ED  (not at [facility], [facility])     Status: Abnormal   Collection Time: 12/09/15  8:11 PM  Result Value Ref Range   Sodium 140 135 - 145 mmol/L   Potassium 4.3 3.5 - 5.1 mmol/L   Chloride 102 101 - 111 mmol/L   BUN 22 (H) 6 - 20 mg/dL   Creatinine, Ser 1.20 0.61 - 1.24 mg/dL   Glucose, Bld 123 (H) 65 - 99 mg/dL   Calcium, Ion 1.12 (L) 1.13 - 1.30 mmol/L   TCO2 24 0 - 100 mmol/L   Hemoglobin 14.3 13.0 - 17.0 g/dL   HCT 42.0 39.0 - 52.0 %  Urine rapid drug screen (hosp performed)not at [facility]     Status: None   Collection Time: 12/09/15  8:49 PM  Result Value Ref Range   Opiates NONE DETECTED NONE DETECTED   Cocaine NONE DETECTED NONE DETECTED   Benzodiazepines NONE DETECTED NONE DETECTED   Amphetamines NONE DETECTED NONE DETECTED   Tetrahydrocannabinol NONE DETECTED NONE DETECTED   Barbiturates NONE DETECTED NONE DETECTED    Comment:        DRUG SCREEN FOR MEDICAL PURPOSES ONLY.  IF CONFIRMATION IS NEEDED FOR ANY PURPOSE, NOTIFY LAB WITHIN 5 DAYS.        LOWEST DETECTABLE LIMITS FOR URINE DRUG SCREEN Drug Class       Cutoff (ng/mL) Amphetamine      1000 Barbiturate      200 Benzodiazepine   027 Tricyclics       741 Opiates          300 Cocaine          300 THC              50   Urinalysis, Routine w reflex microscopic (not at [facility])     Status: None   Collection Time: 12/09/15  8:49 PM  Result Value Ref Range   Color, Urine YELLOW YELLOW   APPearance CLEAR CLEAR   Specific Gravity, Urine 1.017 1.005 - 1.030  pH 6.5 5.0 - 8.0   Glucose, UA NEGATIVE NEGATIVE mg/dL   Hgb urine dipstick NEGATIVE NEGATIVE   Bilirubin Urine NEGATIVE NEGATIVE   Ketones, ur NEGATIVE NEGATIVE mg/dL   Protein, ur NEGATIVE NEGATIVE mg/dL   Nitrite NEGATIVE NEGATIVE   Leukocytes, UA NEGATIVE NEGATIVE     Comment: MICROSCOPIC NOT DONE ON URINES WITH NEGATIVE PROTEIN, BLOOD, LEUKOCYTES, NITRITE, OR GLUCOSE <1000 mg/dL.  Basic metabolic panel     Status: Abnormal   Collection Time: 12/09/15 11:46 PM  Result Value Ref Range   Sodium 140 135 - 145 mmol/L   Potassium 4.1 3.5 - 5.1 mmol/L   Chloride 104 101 - 111 mmol/L   CO2 23 22 - 32 mmol/L   Glucose, Bld 112 (H) 65 - 99 mg/dL   BUN 20 6 - 20 mg/dL   Creatinine, Ser 1.09 0.61 - 1.24 mg/dL   Calcium 9.3 8.9 - 10.3 mg/dL   GFR calc non Af Amer >60 >60 mL/min   GFR calc Af Amer >60 >60 mL/min    Comment: (NOTE) The eGFR has been calculated using the CKD EPI equation. This calculation has not been validated in all clinical situations. eGFR's persistently <60 mL/min signify possible Chronic Kidney Disease.    Anion gap 13 5 - 15  Glucose, capillary     Status: Abnormal   Collection Time: 12/10/15  2:26 AM  Result Value Ref Range   Glucose-Capillary 102 (H) 65 - 99 mg/dL   Comment 1 Notify RN    Comment 2 Document in Chart    Dg Chest 2 View  12/09/2015  CLINICAL DATA:  TIA. Episodes of left-sided weakness lasting 5 minutes. EXAM: CHEST  2 VIEW COMPARISON:  05/26/2015 FINDINGS: Again seen elevation of right hemidiaphragm, minimally increased in degree from prior. Adjacent atelectasis or scarring at the right lung base. Heart at the upper limits normal in size, mediastinal contours are unchanged. No pulmonary edema, pleural effusion, pneumothorax or focal consolidation. Mild degenerative change in the spine. IMPRESSION: Elevation of right hemidiaphragm, with minimal progression from prior. Otherwise no acute process. Electronically Signed   By: Jeb Levering M.D.   On: 12/09/2015 23:05   Mr Brain Wo Contrast  12/09/2015  CLINICAL DATA:  Two 5 minutes episodes of unilateral LEFT-sided weakness today, spontaneous resolution. History of hypertension. EXAM: MRI HEAD WITHOUT CONTRAST MRA HEAD WITHOUT CONTRAST TECHNIQUE: Multiplanar, multiecho  pulse sequences of the brain and surrounding structures were obtained without intravenous contrast. Angiographic images of the head were obtained using MRA technique without contrast. COMPARISON:  None. FINDINGS: MRI HEAD FINDINGS The ventricles and sulci are normal for patient's age. No abnormal parenchymal signal, mass lesions, mass effect. No reduced diffusion to suggest acute ischemia. LEFT inferior basal ganglia small perivascular space. Patchy to confluent supratentorial and pontine white matter FLAIR T2 hyperintensities. Small area LEFT posterior temporal lobe susceptibility artifact without mass-effect or surrounding edema. No abnormal extra-axial fluid collections. No extra-axial masses though, contrast enhanced sequences would be more sensitive. Normal major intracranial vascular flow voids seen at the skull base. Ocular globes and orbital contents are unremarkable though not tailored for evaluation. No abnormal sellar expansion. No suspicious calvarial bone marrow signal. Craniocervical junction maintained. Visualized paranasal sinuses and mastoid air cells are well-aerated. MRA HEAD FINDINGS Anterior circulation: Normal flow related enhancement of the included cervical, petrous, cavernous and supraclinoid internal carotid arteries. Patent anterior communicating artery. Normal flow related enhancement of the anterior and middle cerebral arteries, including distal segments. No large vessel  occlusion, high-grade stenosis, abnormal luminal irregularity, aneurysm. Posterior circulation: RIGHT vertebral artery is dominant. Basilar artery is patent, with normal flow related enhancement of the main branch vessels. Flow related enhancement of the posterior cerebral arteries. High-grade stenosis LEFT P3 segment. No large vessel occlusion, abnormal luminal irregularity, aneurysm. IMPRESSION: MRI HEAD: No acute intracranial process, specifically no acute ischemia. Involutional changes and moderate chronic small  vessel ischemic disease. Susceptibility artifact LEFT posterior temporal lobe may represent old hemorrhage or cavernoma without acute component. MRA HEAD: No acute large vessel occlusion. High-grade stenosis LEFT P3 segment. Electronically Signed   By: Elon Alas M.D.   On: 12/09/2015 23:29   Mr Jodene Nam Head/brain Wo Cm  12/09/2015  CLINICAL DATA:  Two 5 minutes episodes of unilateral LEFT-sided weakness today, spontaneous resolution. History of hypertension. EXAM: MRI HEAD WITHOUT CONTRAST MRA HEAD WITHOUT CONTRAST TECHNIQUE: Multiplanar, multiecho pulse sequences of the brain and surrounding structures were obtained without intravenous contrast. Angiographic images of the head were obtained using MRA technique without contrast. COMPARISON:  None. FINDINGS: MRI HEAD FINDINGS The ventricles and sulci are normal for patient's age. No abnormal parenchymal signal, mass lesions, mass effect. No reduced diffusion to suggest acute ischemia. LEFT inferior basal ganglia small perivascular space. Patchy to confluent supratentorial and pontine white matter FLAIR T2 hyperintensities. Small area LEFT posterior temporal lobe susceptibility artifact without mass-effect or surrounding edema. No abnormal extra-axial fluid collections. No extra-axial masses though, contrast enhanced sequences would be more sensitive. Normal major intracranial vascular flow voids seen at the skull base. Ocular globes and orbital contents are unremarkable though not tailored for evaluation. No abnormal sellar expansion. No suspicious calvarial bone marrow signal. Craniocervical junction maintained. Visualized paranasal sinuses and mastoid air cells are well-aerated. MRA HEAD FINDINGS Anterior circulation: Normal flow related enhancement of the included cervical, petrous, cavernous and supraclinoid internal carotid arteries. Patent anterior communicating artery. Normal flow related enhancement of the anterior and middle cerebral arteries,  including distal segments. No large vessel occlusion, high-grade stenosis, abnormal luminal irregularity, aneurysm. Posterior circulation: RIGHT vertebral artery is dominant. Basilar artery is patent, with normal flow related enhancement of the main branch vessels. Flow related enhancement of the posterior cerebral arteries. High-grade stenosis LEFT P3 segment. No large vessel occlusion, abnormal luminal irregularity, aneurysm. IMPRESSION: MRI HEAD: No acute intracranial process, specifically no acute ischemia. Involutional changes and moderate chronic small vessel ischemic disease. Susceptibility artifact LEFT posterior temporal lobe may represent old hemorrhage or cavernoma without acute component. MRA HEAD: No acute large vessel occlusion. High-grade stenosis LEFT P3 segment. Electronically Signed   By: Elon Alas M.D.   On: 12/09/2015 23:29    Assessment: 82 y.o. male presenting with probable recurrent TIAs. However, a right subcortical acute ischemic infarction cannot be ruled out at this point.  Stroke Risk Factors - hypertension  Plan: 1. HgbA1c, fasting lipid panel 2. PT consult, OT consult 3. Echocardiogram 4. Carotid dopplers 5. Prophylactic therapy-Antiplatelet med: Aspirin  6. Risk factor modification 7. Telemetry monitoring  C.R. Nicole Kindred, MD [facility] [phone]  12/10/2015, 2:49 AM

## 2015-12-11 ENCOUNTER — Observation Stay (HOSPITAL_COMMUNITY): Payer: Medicare Other

## 2015-12-11 DIAGNOSIS — I10 Essential (primary) hypertension: Secondary | ICD-10-CM | POA: Diagnosis not present

## 2015-12-11 DIAGNOSIS — E785 Hyperlipidemia, unspecified: Secondary | ICD-10-CM | POA: Diagnosis not present

## 2015-12-11 DIAGNOSIS — G459 Transient cerebral ischemic attack, unspecified: Secondary | ICD-10-CM | POA: Diagnosis not present

## 2015-12-11 DIAGNOSIS — M7989 Other specified soft tissue disorders: Secondary | ICD-10-CM | POA: Diagnosis not present

## 2015-12-11 LAB — CALCIUM, IONIZED: Calcium, Ionized, Serum: 5.1 mg/dL (ref 4.5–5.6)

## 2015-12-11 MED ORDER — LISINOPRIL 10 MG PO TABS: 10.0000 mg | ORAL_TABLET | Freq: Every day | ORAL | Status: AC

## 2015-12-11 MED ORDER — ATORVASTATIN CALCIUM 20 MG PO TABS: 20.0000 mg | ORAL_TABLET | Freq: Every day | ORAL | Status: AC

## 2015-12-11 MED ORDER — CLOPIDOGREL BISULFATE 75 MG PO TABS
75.0000 mg | ORAL_TABLET | Freq: Every day | ORAL | Status: DC
  Administered 2015-12-11: 75 mg via ORAL
  Filled 2015-12-11: qty 1

## 2015-12-11 MED ORDER — CLOPIDOGREL BISULFATE 75 MG PO TABS: 75.0000 mg | ORAL_TABLET | Freq: Every day | ORAL | Status: AC

## 2015-12-11 NOTE — Discharge Summary (Addendum)
Physician Discharge Summary  Christian [name] ZOX:[id number] DOB: 1929/05/16 DOA: 12/09/2015  PCP: Minda Meo, MD  Admit date: 12/09/2015 Discharge date: 12/11/2015  Time spent: 25* minutes  Recommendations for Outpatient Follow-up:  1. Follow up PCP to discuss the imaging for Liver cyst 2. PCP to follow hemoglobin A1c   Discharge Diagnoses:  Principal Problem:   TIA (transient ischemic attack) Active Problems:   Benign essential HTN   Left leg swelling   Hypocalcemia   Hyperlipidemia   Discharge Condition: Stable  Diet recommendation: Low salt diet  Filed Weights   12/09/15 1958  Weight: 75.07 kg (165 lb 8 oz)    History of present illness:  82 year old male with a past medical history significant for HTN; who presents with complaints of 2 episodes of left-sided weakness today. Symptoms initially started sometime between 6 and 7 p.m. he had been out eating at a restaurant and had come back home. His son helps provide history as he notes being initially called about his father leaning toward the left and the left-sided weakness around 6:52 PM. Reports symptoms only lasting a few minutes and self resolving. However he was called again at 7:04 PM with report of symptoms happening again and he recommended family to call EMS at that time. By the time he arrived at his parent's house he noted that the symptoms have resolved again. Her family members stated that the patient had some slurred speech. During these episodes he was noted to be able to wiggle his toes and fingers but not able to lift his arms or legs. Patient also complained of left-sided foot swelling for last 3-4 days.  Hospital Course:  1. TIA- patient admitted for TIA workup, MRI brain is negative for stroke. Carotid duplex showedBilateral: 1-39% ICA stenosis. Vertebral artery flow is antegrade  and echocardiogram . LDL cholesterol is 97. Prophylactically started on aspirin. 2. Hypertension- patient was on  lisinopril 10 mg daily prn, will change to scheduled lisinopril 10 mg po daily. 3. Hypocalcemia- ionized calcium is 1.14, replaced with 1 gm calcium gluconate   Physical therapy- PT consult, no PT follow up needed             Procedures:  Echo   Carotid duplex  Consultations:  Neurology  Discharge Exam: Filed Vitals:   12/11/15 0544 12/11/15 0940  BP: 148/77 149/65  Pulse: 91 114  Temp: 97.8 F (36.6 C) 97.6 F (36.4 C)  Resp: 16 16    General: Appear in no acute distress Cardiovascular: S1s2 RRR Respiratory: Clear bilaterally  Discharge Instructions   Discharge Instructions    Diet - low sodium heart healthy    Complete by:  As directed      Discharge instructions    Complete by:  As directed   Follow up PCP to discuss the imaging for Cyst in the Liver     Increase activity slowly    Complete by:  As directed           Current Discharge Medication List    START taking these medications   Details  atorvastatin (LIPITOR) 20 MG tablet Take 1 tablet (20 mg total) by mouth daily at 6 PM. Qty: 30 tablet, Refills: 3    clopidogrel (PLAVIX) 75 MG tablet Take 1 tablet (75 mg total) by mouth daily. Qty: 30 tablet, Refills: 2      CONTINUE these medications which have CHANGED   Details  lisinopril (PRINIVIL,ZESTRIL) 10 MG tablet Take 1 tablet (10 mg total) by  mouth daily. Pt. Takes Lisinopril only if BP is 130 systolic or > Qty: 30 tablet, Refills: 2      CONTINUE these medications which have NOT CHANGED   Details  cetirizine (ZYRTEC) 10 MG tablet Take 10 mg by mouth daily as needed for allergies.    Glucosamine-Chondroitin 250-200 MG TABS Take 1 tablet by mouth daily.    Multiple Vitamins-Minerals (CENTRUM SILVER ADULT 50+ PO) Take 1 tablet by mouth daily.    Omega-3 Fatty Acids (FISH OIL) 1000 MG CAPS Take 1 capsule by mouth daily.    omeprazole (PRILOSEC OTC) 20 MG tablet Take 20 mg by mouth daily before breakfast.     OVER THE COUNTER MEDICATION  Take 1 tablet by mouth daily.    vitamin B-12 (CYANOCOBALAMIN) 100 MCG tablet Take 50 mcg by mouth daily.    HYDROcodone-acetaminophen (NORCO) 10-325 MG per tablet Take 1-2 tablets by mouth every 6 (six) hours as needed for severe pain. Qty: 60 tablet, Refills: 0      STOP taking these medications     aspirin EC 325 MG EC tablet        No Known Allergies Follow-up Information    Follow up with ARONSON,RICHARD A, MD.   Specialty:  Internal Medicine   Why:  PCP can check Hemoglobin A1c results   Contact information:   [address] [place] [address] [phone]        The results of significant diagnostics from this hospitalization (including imaging, microbiology, ancillary and laboratory) are listed below for reference.    Significant Diagnostic Studies: Dg Chest 2 View  12/09/2015  CLINICAL DATA:  TIA. Episodes of left-sided weakness lasting 5 minutes. EXAM: CHEST  2 VIEW COMPARISON:  05/26/2015 FINDINGS: Again seen elevation of right hemidiaphragm, minimally increased in degree from prior. Adjacent atelectasis or scarring at the right lung base. Heart at the upper limits normal in size, mediastinal contours are unchanged. No pulmonary edema, pleural effusion, pneumothorax or focal consolidation. Mild degenerative change in the spine. IMPRESSION: Elevation of right hemidiaphragm, with minimal progression from prior. Otherwise no acute process. Electronically Signed   By: Rubye Oaks M.D.   On: 12/09/2015 23:05   Mr Brain Wo Contrast  12/09/2015  CLINICAL DATA:  Two 5 minutes episodes of unilateral LEFT-sided weakness today, spontaneous resolution. History of hypertension. EXAM: MRI HEAD WITHOUT CONTRAST MRA HEAD WITHOUT CONTRAST TECHNIQUE: Multiplanar, multiecho pulse sequences of the brain and surrounding structures were obtained without intravenous contrast. Angiographic images of the head were obtained using MRA technique without contrast. COMPARISON:  None.  FINDINGS: MRI HEAD FINDINGS The ventricles and sulci are normal for patient's age. No abnormal parenchymal signal, mass lesions, mass effect. No reduced diffusion to suggest acute ischemia. LEFT inferior basal ganglia small perivascular space. Patchy to confluent supratentorial and pontine white matter FLAIR T2 hyperintensities. Small area LEFT posterior temporal lobe susceptibility artifact without mass-effect or surrounding edema. No abnormal extra-axial fluid collections. No extra-axial masses though, contrast enhanced sequences would be more sensitive. Normal major intracranial vascular flow voids seen at the skull base. Ocular globes and orbital contents are unremarkable though not tailored for evaluation. No abnormal sellar expansion. No suspicious calvarial bone marrow signal. Craniocervical junction maintained. Visualized paranasal sinuses and mastoid air cells are well-aerated. MRA HEAD FINDINGS Anterior circulation: Normal flow related enhancement of the included cervical, petrous, cavernous and supraclinoid internal carotid arteries. Patent anterior communicating artery. Normal flow related enhancement of the anterior and middle cerebral arteries, including distal segments. No  large vessel occlusion, high-grade stenosis, abnormal luminal irregularity, aneurysm. Posterior circulation: RIGHT vertebral artery is dominant. Basilar artery is patent, with normal flow related enhancement of the main branch vessels. Flow related enhancement of the posterior cerebral arteries. High-grade stenosis LEFT P3 segment. No large vessel occlusion, abnormal luminal irregularity, aneurysm. IMPRESSION: MRI HEAD: No acute intracranial process, specifically no acute ischemia. Involutional changes and moderate chronic small vessel ischemic disease. Susceptibility artifact LEFT posterior temporal lobe may represent old hemorrhage or cavernoma without acute component. MRA HEAD: No acute large vessel occlusion. High-grade stenosis  LEFT P3 segment. Electronically Signed   By: Awilda Metro M.D.   On: 12/09/2015 23:29   Mr [name] Head/brain Wo Cm  12/09/2015  CLINICAL DATA:  Two 5 minutes episodes of unilateral LEFT-sided weakness today, spontaneous resolution. History of hypertension. EXAM: MRI HEAD WITHOUT CONTRAST MRA HEAD WITHOUT CONTRAST TECHNIQUE: Multiplanar, multiecho pulse sequences of the brain and surrounding structures were obtained without intravenous contrast. Angiographic images of the head were obtained using MRA technique without contrast. COMPARISON:  None. FINDINGS: MRI HEAD FINDINGS The ventricles and sulci are normal for patient's age. No abnormal parenchymal signal, mass lesions, mass effect. No reduced diffusion to suggest acute ischemia. LEFT inferior basal ganglia small perivascular space. Patchy to confluent supratentorial and pontine white matter FLAIR T2 hyperintensities. Small area LEFT posterior temporal lobe susceptibility artifact without mass-effect or surrounding edema. No abnormal extra-axial fluid collections. No extra-axial masses though, contrast enhanced sequences would be more sensitive. Normal major intracranial vascular flow voids seen at the skull base. Ocular globes and orbital contents are unremarkable though not tailored for evaluation. No abnormal sellar expansion. No suspicious calvarial bone marrow signal. Craniocervical junction maintained. Visualized paranasal sinuses and mastoid air cells are well-aerated. MRA HEAD FINDINGS Anterior circulation: Normal flow related enhancement of the included cervical, petrous, cavernous and supraclinoid internal carotid arteries. Patent anterior communicating artery. Normal flow related enhancement of the anterior and middle cerebral arteries, including distal segments. No large vessel occlusion, high-grade stenosis, abnormal luminal irregularity, aneurysm. Posterior circulation: RIGHT vertebral artery is dominant. Basilar artery is patent, with normal  flow related enhancement of the main branch vessels. Flow related enhancement of the posterior cerebral arteries. High-grade stenosis LEFT P3 segment. No large vessel occlusion, abnormal luminal irregularity, aneurysm. IMPRESSION: MRI HEAD: No acute intracranial process, specifically no acute ischemia. Involutional changes and moderate chronic small vessel ischemic disease. Susceptibility artifact LEFT posterior temporal lobe may represent old hemorrhage or cavernoma without acute component. MRA HEAD: No acute large vessel occlusion. High-grade stenosis LEFT P3 segment. Electronically Signed   By: Awilda Metro M.D.   On: 12/09/2015 23:29      Labs: Basic Metabolic Panel:  Recent Labs Lab 12/09/15 2001 12/09/15 2011 12/09/15 2346 12/10/15 0624  NA 140 140 140  --   K 4.5 4.3 4.1  --   CL 103 102 104  --   CO2 25  --  23  --   GLUCOSE 131* 123* 112*  --   BUN 19 22* 20  --   CREATININE 1.10 1.20 1.09  --   CALCIUM 9.3  --  9.3  --   MG  --   --   --  1.8   Liver Function Tests:  Recent Labs Lab 12/09/15 2001  AST 53*  ALT 26  ALKPHOS 133*  BILITOT 0.9  PROT 5.6*  ALBUMIN 3.7   CBC:  Recent Labs Lab 12/09/15 2001 12/09/15 2011  WBC 4.9  --  NEUTROABS 2.8  --   HGB 13.2 14.3  HCT 40.5 42.0  MCV 86.2  --   PLT 198  --     CBG:  Recent Labs Lab 12/10/15 0226  GLUCAP 102*       Signed:  Mauro Kaufmann S MD.  [facility] 12/11/2015, 4:20 PM

## 2015-12-11 NOTE — Progress Notes (Addendum)
STROKE TEAM PROGRESS NOTE   HISTORY OF PRESENT ILLNESS Christian [name] is an 82 y.o. male with a history of hypertension, diverticulosis and arthritis, brought to the emergency room following 2 episodes of left-sided weakness, slurred speech and facial droop, the last of which occurred at 7:05 PM on 12/09/2015. He has no previous history of stroke nor TIA. He's been taking aspirin 325 mg per day. MRI of his brain showed no acute intracranial abnormality. MRA was unremarkable except for a left P3 high-grade stenosis. Patient's deficits resolved without treatment intervention. NIH stroke score the time of this evaluation was 0.  LSN: 7:05 PM on 12/09/2015 tPA Given: No: Deficits resolved mRankin:   SUBJECTIVE (INTERVAL HISTORY) His daughter was at the bedside.  Overall he feels his condition is improved.  No complaints for ROS   OBJECTIVE Temp:  [97.6 F (36.4 C)-98.6 F (37 C)] 97.6 F (36.4 C) (02/26 0940) Pulse Rate:  [63-114] 114 (02/26 0940) Cardiac Rhythm:  [-] Normal sinus rhythm (02/25 1900) Resp:  [16-18] 16 (02/26 0940) BP: (146-165)/(65-91) 149/65 mmHg (02/26 0940) SpO2:  [94 %-98 %] 98 % (02/26 0940)  CBC:   Recent Labs Lab 12/09/15 2001 12/09/15 2011  WBC 4.9  --   NEUTROABS 2.8  --   HGB 13.2 14.3  HCT 40.5 42.0  MCV 86.2  --   PLT 198  --     Basic Metabolic Panel:   Recent Labs Lab 12/09/15 2001 12/09/15 2011 12/09/15 2346 12/10/15 0624  NA 140 140 140  --   K 4.5 4.3 4.1  --   CL 103 102 104  --   CO2 25  --  23  --   GLUCOSE 131* 123* 112*  --   BUN 19 22* 20  --   CREATININE 1.10 1.20 1.09  --   CALCIUM 9.3  --  9.3  --   MG  --   --   --  1.8    Lipid Panel:     Component Value Date/Time   CHOL 176 12/10/2015 0624   TRIG 39 12/10/2015 0624   HDL 71 12/10/2015 0624   CHOLHDL 2.5 12/10/2015 0624   VLDL 8 12/10/2015 0624   LDLCALC 97 12/10/2015 0624   HgbA1c: No results found for: HGBA1C Urine Drug Screen:     Component Value  Date/Time   LABOPIA NONE DETECTED 12/09/2015 2049   COCAINSCRNUR NONE DETECTED 12/09/2015 2049   LABBENZ NONE DETECTED 12/09/2015 2049   AMPHETMU NONE DETECTED 12/09/2015 2049   THCU NONE DETECTED 12/09/2015 2049   LABBARB NONE DETECTED 12/09/2015 2049      IMAGING  Dg Chest 2 View 12/09/2015   Elevation of right hemidiaphragm, with minimal progression from prior. Otherwise no acute process.    Mr Christian [name] Head/brain Wo Cm 12/09/2015    MRI HEAD:  No acute intracranial process, specifically no acute ischemia. Involutional changes and moderate chronic small vessel ischemic disease. Susceptibility artifact LEFT posterior temporal lobe may represent old hemorrhage or cavernoma without acute component.   MRA HEAD:  No acute large vessel occlusion. High-grade stenosis LEFT P3 segment.   PHYSICAL EXAM HEENT- Normocephalic, no lesions, without obvious abnormality. Normal external eye and conjunctiva.Normal external nose, mucus membranes and septum. Normal pharynx. Neck supple with no masses, nodes, nodules or enlargement. Cardiovascular - regular rate and rhythm, S1, S2 normal, no murmur, click, rub or gallop Lungs - chest clear, no wheezing, rales, normal symmetric air entry Abdomen - soft, non-tender; bowel sounds  normal; no masses, no organomegaly Extremities - no joint deformities, effusion, or inflammation and no edema  Neurologic Examination: Mental Status: Alert, oriented, thought content appropriate. Speech fluent without evidence of aphasia. Able to follow commands without difficulty. Cranial Nerves: II-Visual fields were normal. III/IV/VI-Pupils were equal and reacted normally to light. Extraocular movements were full and conjugate.  V/VII-no facial numbness and no facial weakness. VIII-normal. X-low volume speech without dysarthria. XI: trapezius strength/neck flexion strength normal bilaterally XII-midline tongue extension with normal strength. Motor: 5/5  bilaterally with normal tone and bulk Sensory: Normal throughout. Deep Tendon Reflexes: 1+ and symmetric in upper extremities and absent in lower extremities. Cerebellar: Normal finger-to-nose testing.  ASSESSMENT/PLAN Christian [name] is a 82 y.o. male with history of hypertension, diverticulosis and arthritis,  presenting with 2 episodes of left-sided weakness, slurred speech and facial droop, . He did not receive IV t-PA due to resolution of deficits.  TIA:  Non-dominant   Resultant  resolution of deficits  MRI  no acute findings  MRA  High-grade stenosis LEFT P3 segment.   Carotid Doppler Bilateral: 1-39% ICA stenosis. Vertebral artery flow is antegrade.   2D Echo:  Left ventricle: The cavity size was normal. Wall thickness was increased in a pattern of mild LVH. Systolic function was normal. The estimated ejection fraction was in the range of 55% to 60%. Wall motion was normal; there were no regional wall motion abnormalities. Doppler parameters are consistent with abnormal left ventricular relaxation (grade 1 diastolic dysfunction). - Aortic valve: There was mild regurgitation. - Left atrium: The atrium was mildly dilated. - Impressions: There is a very large cystic structre in the liver. Appears to be simple liver cyst. Suggest dedicated imaging study to further evalaute.  Impressions:  - There is a very large cystic structre in the liver. Appears to be simple liver cyst. Suggest dedicated imaging study to further evalaute.  Left Lower extremity venous Dopplers - Left: No evidence of DVT, superficial thrombosis, or Baker's cyst.  LDL - 97  HgbA1c pending  VTE prophylaxis - Lovenox Diet Heart Room service appropriate?: Yes; Fluid consistency:: Thin  aspirin 325 mg daily prior to admission, now on aspirin 325 mg daily consider change to Plavix  Patient counseled to be compliant with his antithrombotic medications  Ongoing aggressive  stroke risk factor management  Therapy recommendations: No follow-up physical therapy  Disposition: Pending  Hypertension  Mildly high blood pressures  Hyperlipidemia  Home meds: No lipid lowering medications prior to admission  LDL 97, goal < 70  Lipitor 20 mg daily started  Continue statin at discharge    Other Stroke Risk Factors  Advanced age   Other Active Problems  High-grade stenosis LEFT P3 segment.   Hospital day #   Delton See PA-C [facility] Pager [phone] 12/11/2015, 10:16 AM  ATTENDING NOTE: Patient was seen and examined by me personally. Documentation reflects findings. The laboratory and radiographic studies reviewed by me. ROS completed by me personally and pertinent positives fully documented  Condition: STABLE  Assessment and plan completed by me personally and fully documented above. Plans/Recommendations include:     Ok to discharge.  Since patient on ASA at time of stroke, consider change to Plavix  daily  Discussed the choice of starting cholesterol medication based on LDL with patient and his daughter.  They agreed with plan.  Also discussed the importance of compliance with BP meds.  Apparently patient had taken himself off of meds when SBPs where in  the 110s despite not having symptoms  Hemoglobin A1C is pending.  Patient has follow-up with PCP in next two weeks.  This should be reviewed at that time  Patient should be scheduled for follow-up evaluation of cystic liver lesion incidentally found during ECHO  SIGNED BY: Dr. Sula Soda     To contact Stroke Continuity provider, please refer to [email or web address]. After hours, contact General Neurology

## 2015-12-11 NOTE — Progress Notes (Signed)
  Echocardiogram 2D Echocardiogram has been performed.  Delcie Roch 12/11/2015, 8:46 AM

## 2015-12-11 NOTE — Progress Notes (Addendum)
[facility] PROGRESS NOTE  Christian [name]:[id number] DOB: 1929/01/30 DOA: 12/09/2015 PCP: Minda Meo, MD  Assessment/Plan: 1. TIA- patient admitted for TIA workup, MRI brain is negative for stroke. Carotid duplex  Showed Bilateral: 1-39% ICA stenosis. Vertebral artery flow is antegrade and echocardiogram is pending. LDL cholesterol is 97. Prophylactically started on aspirin. 2. Hypertension- patient was on lisinopril 10 mg daily prn, changed to scheduled lisinopril 10 mg po daily. 3. Hypocalcemia- ionized calcium is 1.14, replaced with 1 gm calcium gluconate. 4. DVT prophylaxis- Lovenox.  Code Status: Full code Family Communication: Discussed with patient's son at bedside Disposition Plan: Pending workup for TIA   Consultants:  Neurology  Procedures:  Carotid duplex  Echocardiogram  Lower extremity venous duplex  Antibiotics:  None  HPI/Subjective: 82 year old male with a past medical history significant for HTN; who presents with complaints of 2 episodes of left-sided weakness today. Symptoms initially started sometime between 6 and 7 p.m. he had been out eating at a restaurant and had come back home. His son helps provide history as he notes being initially called about his father leaning toward the left and the left-sided weakness around 6:52 PM. Reports symptoms only lasting a few minutes and self resolving. However he was called again at 7:04 PM with report of symptoms happening again and he recommended family to call EMS at that time. By the time he arrived at his parent's house he noted that the symptoms have resolved again. Her family members stated that the patient had some slurred speech. During these episodes he was noted to be able to wiggle his toes and fingers but not able to lift his arms or legs. Patient also complained of left-sided foot swelling for last 3-4 days.  Patient denies any symptoms.    Objective: Filed Vitals:   12/11/15 0544  12/11/15 0940  BP: 148/77 149/65  Pulse: 91 114  Temp: 97.8 F (36.6 C) 97.6 F (36.4 C)  Resp: 16 16    Intake/Output Summary (Last 24 hours) at 12/11/15 1246 Last data filed at 12/10/15 1806  Gross per 24 hour  Intake    240 ml  Output      0 ml  Net    240 ml   Filed Weights   12/09/15 1958  Weight: 75.07 kg (165 lb 8 oz)    Exam:   General:  Appears in no acute distress  Cardiovascular: S1S2 RRR  Respiratory: Clear bilaterally  Abdomen: Soft, nontender, no organomegaly  Musculoskeletal: no cyanosis/clubbing/edema of lower extremities   Data Reviewed: Basic Metabolic Panel:  Recent Labs Lab 12/09/15 2001 12/09/15 2011 12/09/15 2346 12/10/15 0624  NA 140 140 140  --   K 4.5 4.3 4.1  --   CL 103 102 104  --   CO2 25  --  23  --   GLUCOSE 131* 123* 112*  --   BUN 19 22* 20  --   CREATININE 1.10 1.20 1.09  --   CALCIUM 9.3  --  9.3  --   MG  --   --   --  1.8   Liver Function Tests:  Recent Labs Lab 12/09/15 2001  AST 53*  ALT 26  ALKPHOS 133*  BILITOT 0.9  PROT 5.6*  ALBUMIN 3.7     Recent Labs Lab 12/09/15 2001 12/09/15 2011  WBC 4.9  --   NEUTROABS 2.8  --   HGB 13.2 14.3  HCT 40.5 42.0  MCV 86.2  --   PLT  198  --    CBG:  Recent Labs Lab 12/10/15 0226  GLUCAP 102*      Studies: Dg Chest 2 View  12/09/2015  CLINICAL DATA:  TIA. Episodes of left-sided weakness lasting 5 minutes. EXAM: CHEST  2 VIEW COMPARISON:  05/26/2015 FINDINGS: Again seen elevation of right hemidiaphragm, minimally increased in degree from prior. Adjacent atelectasis or scarring at the right lung base. Heart at the upper limits normal in size, mediastinal contours are unchanged. No pulmonary edema, pleural effusion, pneumothorax or focal consolidation. Mild degenerative change in the spine. IMPRESSION: Elevation of right hemidiaphragm, with minimal progression from prior. Otherwise no acute process. Electronically Signed   By: Rubye Oaks M.D.   On:  12/09/2015 23:05   Mr Brain Wo Contrast  12/09/2015  CLINICAL DATA:  Two 5 minutes episodes of unilateral LEFT-sided weakness today, spontaneous resolution. History of hypertension. EXAM: MRI HEAD WITHOUT CONTRAST MRA HEAD WITHOUT CONTRAST TECHNIQUE: Multiplanar, multiecho pulse sequences of the brain and surrounding structures were obtained without intravenous contrast. Angiographic images of the head were obtained using MRA technique without contrast. COMPARISON:  None. FINDINGS: MRI HEAD FINDINGS The ventricles and sulci are normal for patient's age. No abnormal parenchymal signal, mass lesions, mass effect. No reduced diffusion to suggest acute ischemia. LEFT inferior basal ganglia small perivascular space. Patchy to confluent supratentorial and pontine white matter FLAIR T2 hyperintensities. Small area LEFT posterior temporal lobe susceptibility artifact without mass-effect or surrounding edema. No abnormal extra-axial fluid collections. No extra-axial masses though, contrast enhanced sequences would be more sensitive. Normal major intracranial vascular flow voids seen at the skull base. Ocular globes and orbital contents are unremarkable though not tailored for evaluation. No abnormal sellar expansion. No suspicious calvarial bone marrow signal. Craniocervical junction maintained. Visualized paranasal sinuses and mastoid air cells are well-aerated. MRA HEAD FINDINGS Anterior circulation: Normal flow related enhancement of the included cervical, petrous, cavernous and supraclinoid internal carotid arteries. Patent anterior communicating artery. Normal flow related enhancement of the anterior and middle cerebral arteries, including distal segments. No large vessel occlusion, high-grade stenosis, abnormal luminal irregularity, aneurysm. Posterior circulation: RIGHT vertebral artery is dominant. Basilar artery is patent, with normal flow related enhancement of the main branch vessels. Flow related enhancement  of the posterior cerebral arteries. High-grade stenosis LEFT P3 segment. No large vessel occlusion, abnormal luminal irregularity, aneurysm. IMPRESSION: MRI HEAD: No acute intracranial process, specifically no acute ischemia. Involutional changes and moderate chronic small vessel ischemic disease. Susceptibility artifact LEFT posterior temporal lobe may represent old hemorrhage or cavernoma without acute component. MRA HEAD: No acute large vessel occlusion. High-grade stenosis LEFT P3 segment. Electronically Signed   By: Awilda Metro M.D.   On: 12/09/2015 23:29   Mr [name] Head/brain Wo Cm  12/09/2015  CLINICAL DATA:  Two 5 minutes episodes of unilateral LEFT-sided weakness today, spontaneous resolution. History of hypertension. EXAM: MRI HEAD WITHOUT CONTRAST MRA HEAD WITHOUT CONTRAST TECHNIQUE: Multiplanar, multiecho pulse sequences of the brain and surrounding structures were obtained without intravenous contrast. Angiographic images of the head were obtained using MRA technique without contrast. COMPARISON:  None. FINDINGS: MRI HEAD FINDINGS The ventricles and sulci are normal for patient's age. No abnormal parenchymal signal, mass lesions, mass effect. No reduced diffusion to suggest acute ischemia. LEFT inferior basal ganglia small perivascular space. Patchy to confluent supratentorial and pontine white matter FLAIR T2 hyperintensities. Small area LEFT posterior temporal lobe susceptibility artifact without mass-effect or surrounding edema. No abnormal extra-axial fluid collections. No extra-axial masses though, contrast  enhanced sequences would be more sensitive. Normal major intracranial vascular flow voids seen at the skull base. Ocular globes and orbital contents are unremarkable though not tailored for evaluation. No abnormal sellar expansion. No suspicious calvarial bone marrow signal. Craniocervical junction maintained. Visualized paranasal sinuses and mastoid air cells are well-aerated. MRA HEAD  FINDINGS Anterior circulation: Normal flow related enhancement of the included cervical, petrous, cavernous and supraclinoid internal carotid arteries. Patent anterior communicating artery. Normal flow related enhancement of the anterior and middle cerebral arteries, including distal segments. No large vessel occlusion, high-grade stenosis, abnormal luminal irregularity, aneurysm. Posterior circulation: RIGHT vertebral artery is dominant. Basilar artery is patent, with normal flow related enhancement of the main branch vessels. Flow related enhancement of the posterior cerebral arteries. High-grade stenosis LEFT P3 segment. No large vessel occlusion, abnormal luminal irregularity, aneurysm. IMPRESSION: MRI HEAD: No acute intracranial process, specifically no acute ischemia. Involutional changes and moderate chronic small vessel ischemic disease. Susceptibility artifact LEFT posterior temporal lobe may represent old hemorrhage or cavernoma without acute component. MRA HEAD: No acute large vessel occlusion. High-grade stenosis LEFT P3 segment. Electronically Signed   By: Awilda Metro M.D.   On: 12/09/2015 23:29    Scheduled Meds: . aspirin EC  325 mg Oral Q breakfast  . atorvastatin  20 mg Oral q1800  . enoxaparin (LOVENOX) injection  40 mg Subcutaneous Q24H  . lisinopril  10 mg Oral Daily  . loratadine  10 mg Oral Daily  . multivitamin with minerals  1 tablet Oral Daily  . pantoprazole  40 mg Oral QAC breakfast  . senna-docusate  1 tablet Oral QHS   Continuous Infusions:   Principal Problem:   TIA (transient ischemic attack) Active Problems:   Benign essential HTN   Left leg swelling   Hypocalcemia    Time spent: 25 min    [facility]  [facility] Pager [phone]. If 7PM-7AM, please contact night-coverage at [email or web address], password [facility] 12/11/2015, 12:46 PM

## 2015-12-11 NOTE — Progress Notes (Signed)
Discharge orders received.  Discharge instructions and follow-up appointments reviewed with the patient and his daughter.  VSS upon discharge.  IV removed and education complete.  Walked out to main entrance, all belongings sent with the patient. Sondra Come, RN

## 2015-12-12 LAB — HEMOGLOBIN A1C
Hgb A1c MFr Bld: 6 % — ABNORMAL HIGH (ref 4.8–5.6)
MEAN PLASMA GLUCOSE: 126 mg/dL

## 2016-08-28 ENCOUNTER — Ambulatory Visit (INDEPENDENT_AMBULATORY_CARE_PROVIDER_SITE_OTHER): Payer: Medicare Other

## 2016-08-28 ENCOUNTER — Ambulatory Visit (INDEPENDENT_AMBULATORY_CARE_PROVIDER_SITE_OTHER): Payer: Medicare Other | Admitting: Sports Medicine

## 2016-08-28 ENCOUNTER — Encounter (INDEPENDENT_AMBULATORY_CARE_PROVIDER_SITE_OTHER): Payer: Self-pay | Admitting: Sports Medicine

## 2016-08-28 VITALS — BP 158/89 | HR 44 | Ht 70.0 in | Wt 165.0 lb

## 2016-08-28 DIAGNOSIS — M79605 Pain in left leg: Secondary | ICD-10-CM

## 2016-08-28 DIAGNOSIS — M1612 Unilateral primary osteoarthritis, left hip: Secondary | ICD-10-CM

## 2016-08-28 NOTE — Progress Notes (Signed)
Christian MarvelCharles E [name] - 82 y.o. male MRN [id number]  Date of birth: January 27, 1929  Office Visit Note: Visit Date: 08/28/2016 PCP: Minda MeoARONSON,RICHARD A, MD Referred by: Geoffry ParadiseAronson, Richard, MD  Subjective: Chief Complaint  Patient presents with  . Left Hip - New Evaluation  . Left hip    Ambulates with a cane.  Problems with left hip for over a year.  States when going up the steps, he feels a pop and causes him pain.  Over the weekend he did some raking  and on Monday , he couldn't really put any weight on his left hip.  States that pain is in lower back and radiates into his left hip and left leg.     HPI: Acute onset of left hip pain as outlined above. He is tried anti-inflammatories without significant improvement. He is had issues with his back for quite some time but does seem significantly different. Pain is localized to the left posterior buttock radiating to the groin. Pain with weight-bearing.  ROS: Denies any fevers, chills recent weight gain or weight loss.. Otherwise per HPI.  Assessment & Plan: Visit Diagnoses:  1. Pain in left leg   2. Arthritis of left hip     Plan: Injection today as below for both diagnostic & therapeutic purposes. Patient was able to quickly stand up & ambulate without significant pain following the injection today. Reported only a small residual amount of stiffness in the hip but the pain had significantly improved. He will follow-up for persistent or recurrent pain. > Consider: Further evaluation of lumbar spine if persistently symptomatic.  Procedure:  US Guided Left Hip Intra-articular Injection Date/Time: 08/28/2016 10:53 AM Performed by: Gaspar BiddingIGBY, Nubia Ziesmer D Authorized by: Gaspar BiddingIGBY, Dannie Hattabaugh D   Location:  Hip Site:  L hip joint Needle Size:  22 G Needle Length:  3.5 inches Ultrasound Guidance: Yes   Fluoroscopic Guidance: No   Arthrogram: No   Medications:  2 mL bupivacaine 0.5 %; 5 mL lidocaine 1 %; 80 mg methylPREDNISolone acetate 40 MG/ML  The patient's  clinical condition is marked by substantial pain and/or significant functional disability. Other conservative therapy has not provided relief, is contraindicated, or not appropriate. There is a reasonable likelihood that injection will significantly improve the patient's pain and/or functional impairment.  After discussing the risks, benefits and expected outcomes of the injection and all questions were reviewed and answered, the patient wished to undergo the above named procedure.  Verbal consent was obtained. The target sight was prepped with alcohol scrub. Local anesthesia was obtained with ethyl chloride. Under real-time ultrasound guidance, Injection of the target structure was performed using the above needle and medications under sterile stopcock technique. Band-Aid  was applied. The patient tolerated this procedure well with no immediate complications. Post injection instructions were provided.     Meds: No orders of the defined types were placed in this encounter.  Follow-up: Return if symptoms worsen or fail to improve.   Clinical History: No specialty comments available.  He reports that he has never smoked. He has never used smokeless tobacco.   Recent Labs  12/10/15 0624  HGBA1C 6.0*    Objective:  VS:  HT:5\' 10"  (177.8 cm)   WT:165 lb (74.8 kg)  BMI:23.7    BP:(!) 158/89  HR:(!) 44bpm  TEMP: ( )  RESP:  Physical Exam: Elderly male in no acute distress alert & appropriate  Left hip: Minimal pain with Fabere testing & log roll however persistent pain with Stinchfield testing. Minimal  pain with straight leg raise. Pain is worse with weight-bearing.  Imaging: Xr Hip Unilat W Or W/o Pelvis 2-3 Views Left  Result Date: 08/29/2016 Findings: Moderate degenerative changes with loss of joint space in bilateral hips with osteophytic spurring & subchondral sclerosis with subchondral cyst formation in the left greater than right hip. He has a small cam deformity bilaterally Impression:  Degenerative arthropathy of the spine as well as hips.  Xr Lumbar Spine 2-3 Views  Result Date: 08/29/2016 Findings: Multilevel degenerative change with degenerative spondylolisthesis of L4 on L5 with significant loss of disc space throughout the lumbar spine. Significant osteophytic spurring between L1 & L2. No soft tissue masses. Impression: multilevel degenerative changes

## 2016-08-29 ENCOUNTER — Telehealth (INDEPENDENT_AMBULATORY_CARE_PROVIDER_SITE_OTHER): Payer: Self-pay

## 2016-08-29 MED ORDER — BUPIVACAINE HCL 0.5 % IJ SOLN
2.0000 mL | INTRAMUSCULAR | Status: AC | PRN
  Administered 2016-08-28: 2 mL via INTRA_ARTICULAR

## 2016-08-29 MED ORDER — METHYLPREDNISOLONE ACETATE 40 MG/ML IJ SUSP
80.0000 mg | INTRAMUSCULAR | Status: AC | PRN
  Administered 2016-08-28: 80 mg

## 2016-08-29 MED ORDER — LIDOCAINE HCL 1 % IJ SOLN
5.0000 mL | INTRAMUSCULAR | Status: AC | PRN
  Administered 2016-08-28: 5 mL

## 2016-08-29 MED ORDER — TRAMADOL HCL 50 MG PO TABS: 50.0000 mg | ORAL_TABLET | Freq: Four times a day (QID) | ORAL | 0 refills | Status: AC | PRN

## 2016-08-29 NOTE — Telephone Encounter (Signed)
See message concerning injection not helping.

## 2016-08-29 NOTE — Telephone Encounter (Signed)
As I discussed with him yesterday the injection does sometimes wear off in you can't have increased pain the following day. Patient is allergic to hydrocodone with an unknown reaction. I will give him a short course of tramadol to see if this is beneficial but I suspect that he will have improvement over the next 2-3 days. Please call or fax in the prescription of tramadol to his pharmacy. This prescription was printed

## 2016-08-29 NOTE — Addendum Note (Signed)
Addended by: Gaspar BiddingIGBY, Leilah Polimeni D on: 08/29/2016 01:22 PM   Modules accepted: Orders

## 2016-08-29 NOTE — Telephone Encounter (Signed)
Patient called and states Injection given yesterday has not helped at all. Do you have any other suggestions on what to do next? Do you want to see him again, do you want to prescribe something he states he don't know what to do. Please call him at [phone]

## 2016-08-30 ENCOUNTER — Telehealth (INDEPENDENT_AMBULATORY_CARE_PROVIDER_SITE_OTHER): Payer: Self-pay | Admitting: *Deleted

## 2016-08-30 NOTE — Telephone Encounter (Signed)
Pt. Called again about inj. Not helping and requesting call back.

## 2016-08-30 NOTE — Telephone Encounter (Signed)
Talked with patient and advised him of message concerning injection and Rx for Tramadol.  Faxed Rx to [facility] at [phone]

## 2016-08-30 NOTE — Telephone Encounter (Signed)
Talked with patient and advised him of message concerning injection and Rx for Tramadol.  Faxed Rx to [facility] at [phone] 

## 2016-11-12 IMAGING — MR MR HEAD W/O CM
9 of 12 series · 31 of 48 positions shown · non-contrast
Comparison: None.

CLINICAL DATA: Two 5 minutes episodes of unilateral LEFT-sided
weakness today, spontaneous resolution. History of hypertension.

EXAM:
MRI HEAD WITHOUT CONTRAST
MRA HEAD WITHOUT CONTRAST
TECHNIQUE: Multiplanar, multiecho pulse sequences of the brain and surrounding
structures were obtained without intravenous contrast. Angiographic
images of the head were obtained using MRA technique without
contrast.

[Series 3: T1 · sagittal · 5.0mm · 0.47mm/px · 1 of 26 slices shown]
[im 1/26]
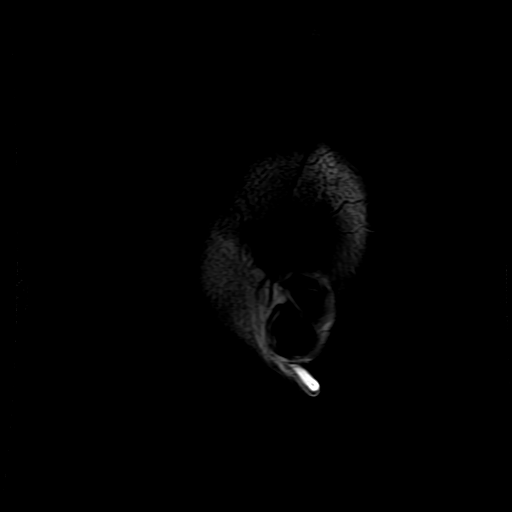

[Series 4: DWI · axial · 3.0mm · 1.09mm/px · z∈[-67,+76]mm · 7 of 98 slices shown (1 of 4)]
[im 1/98]
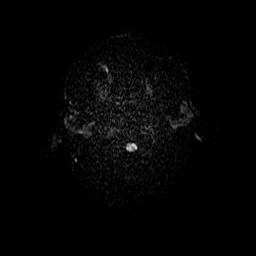
[im 17/98]
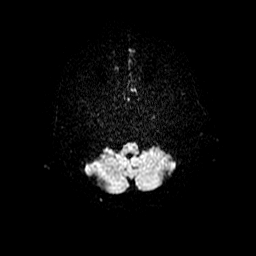
[im 33/98]
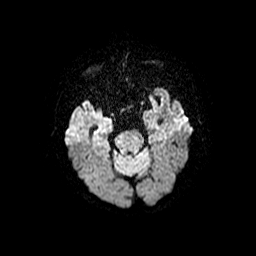
[im 49/98]
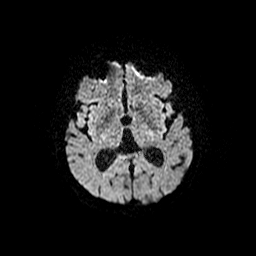
[im 65/98]
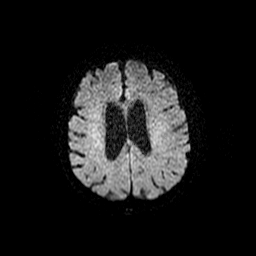
[im 81/98]
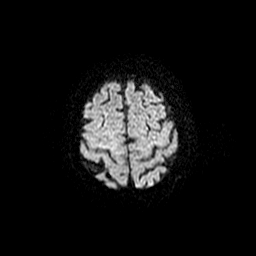
[im 98/98]
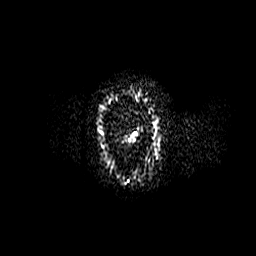

[Series 5: (id) mt fs · axial · 1.4mm · 0.43mm/px · z∈[-53,-1]mm · 5 of 136 slices shown]
[im 1/136]
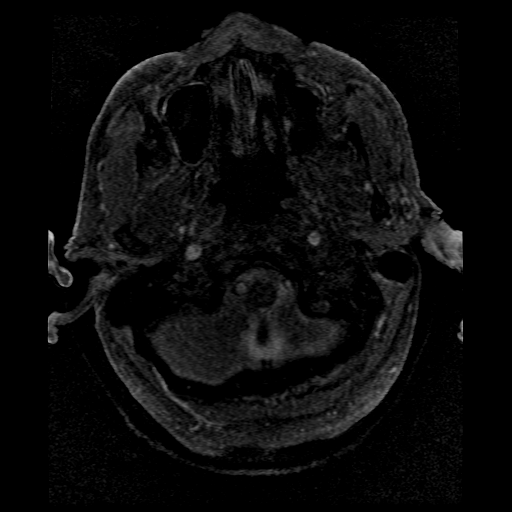
[im 16/136]
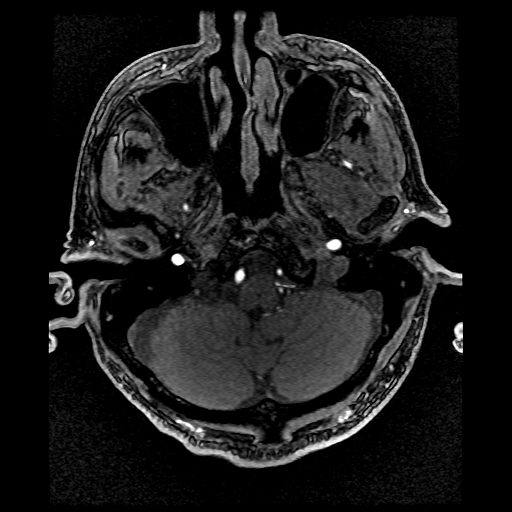
[im 46/136]
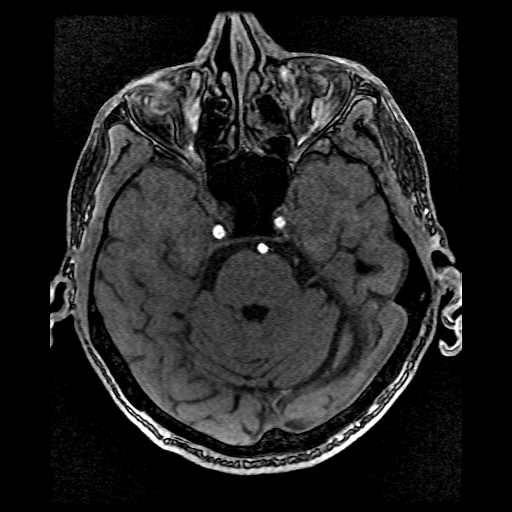
[im 61/136]
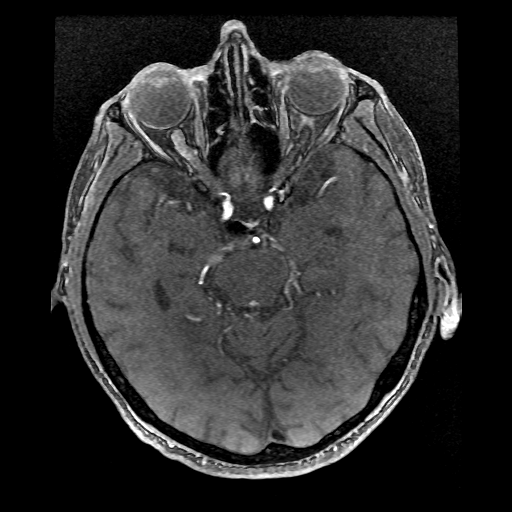
[im 76/136]
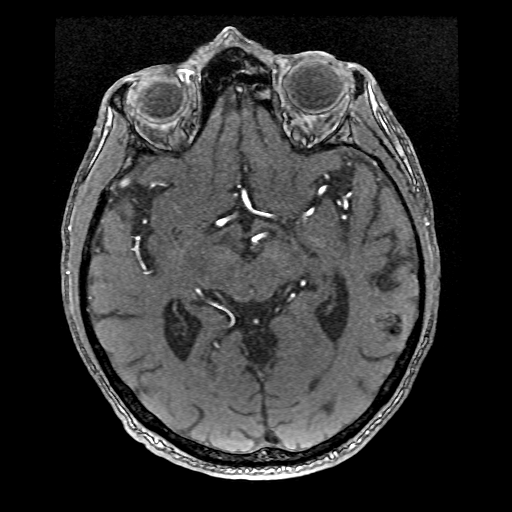

[Series 6: T2 · axial · 5.0mm · 0.43mm/px · z∈[-70,+85]mm · 2 of 27 slices shown (1 of 2)]
[im 1/27]
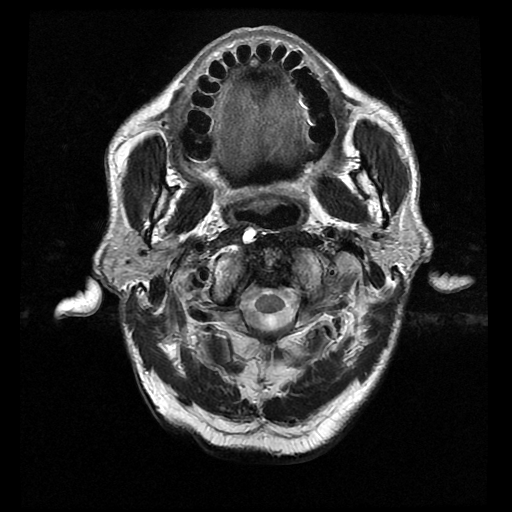
[im 27/27]
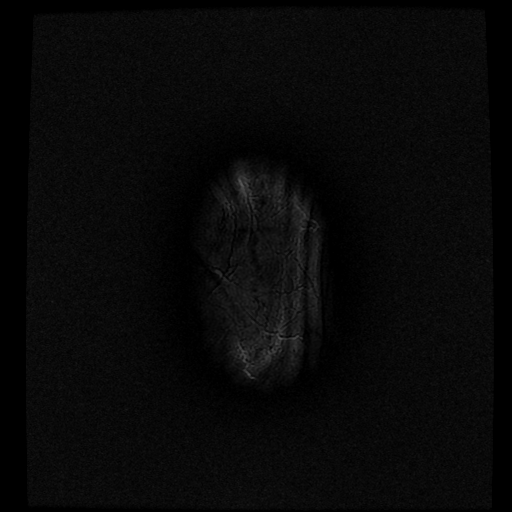

[Series 7: DWI · coronal · 5.0mm · 1.09mm/px · 5 of 72 slices shown (2 of 4)]
[im 1/72]
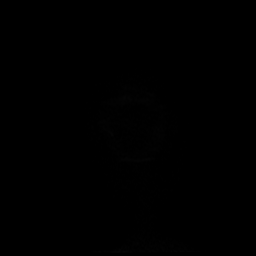
[im 18/72]
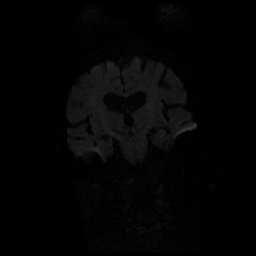
[im 36/72]
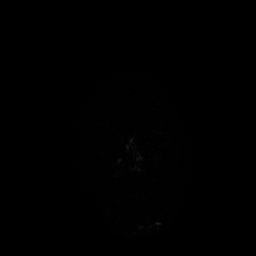
[im 54/72]
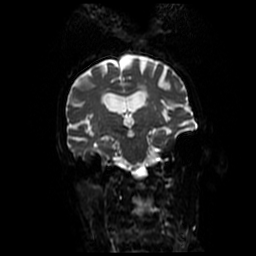
[im 72/72]
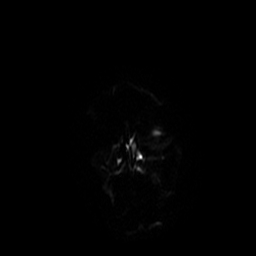

[Series 8: FLAIR · axial · 5.0mm · 0.43mm/px · z∈[-70,+85]mm · 2 of 27 slices shown]
[im 1/27]
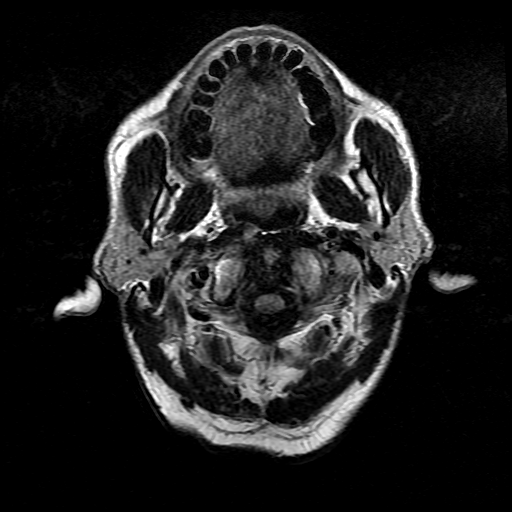
[im 27/27]
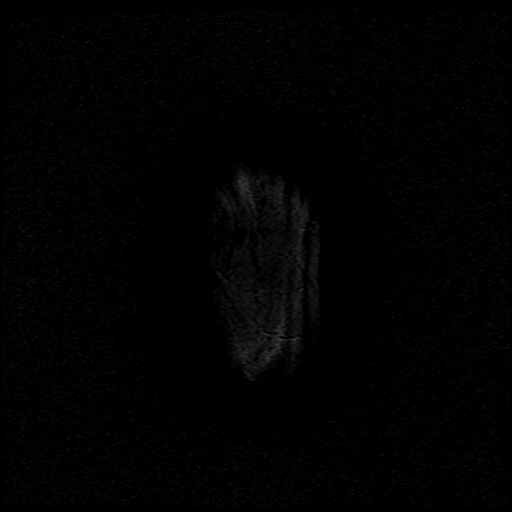

[Series 11: T2 · coronal · 5.0mm · 0.47mm/px · 2 of 30 slices shown (2 of 2)]
[im 1/30]
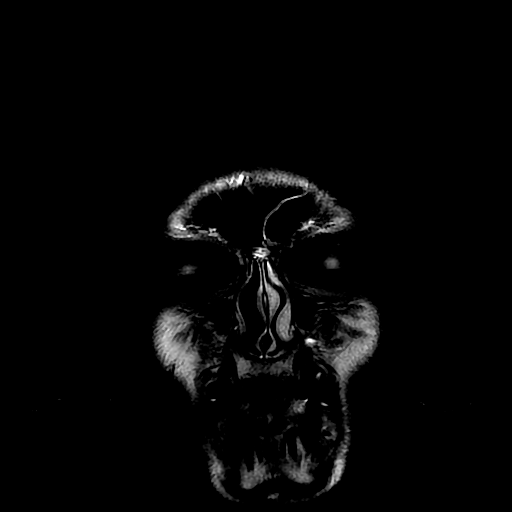
[im 30/30]
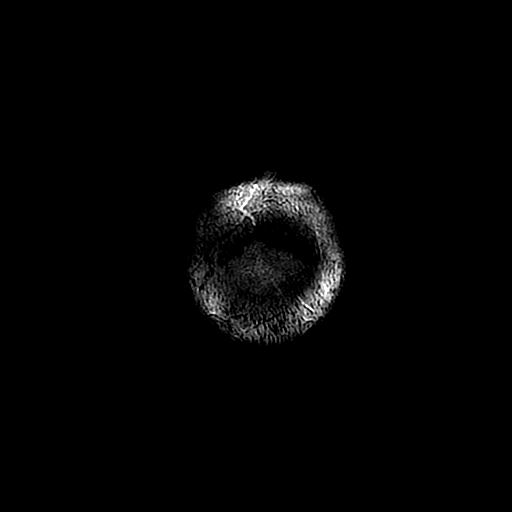

[Series 400: DWI · axial · 3.0mm · 1.09mm/px · z∈[-67,+76]mm · 4 of 49 slices shown (3 of 4)]
[im 1/49]
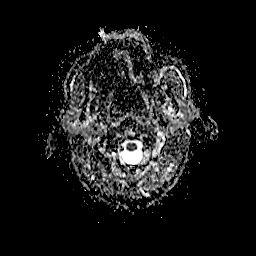
[im 17/49]
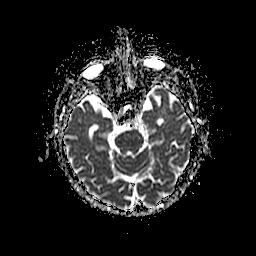
[im 33/49]
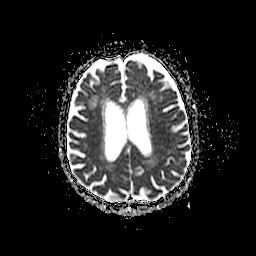
[im 49/49]
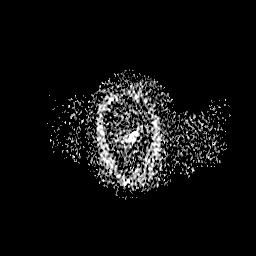

[Series 700: DWI · coronal · 5.0mm · 1.09mm/px · 3 of 36 slices shown (4 of 4)]
[im 1/36]
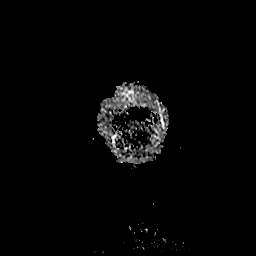
[im 18/36]
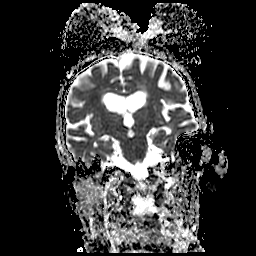
[im 36/36]
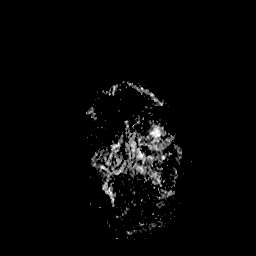

[31 of 48 positions shown; findings below may reference images not displayed]

FINDINGS: MRI HEAD FINDINGS

The ventricles and sulci are normal for patient's age. No abnormal
parenchymal signal, mass lesions, mass effect. No reduced diffusion
to suggest acute ischemia. LEFT inferior basal ganglia small
perivascular space. Patchy to confluent supratentorial and pontine
white matter FLAIR T2 hyperintensities. Small area LEFT posterior
temporal lobe susceptibility artifact without mass-effect or
surrounding edema.

No abnormal extra-axial fluid collections. No extra-axial masses
though, contrast enhanced sequences would be more sensitive. Normal
major intracranial vascular flow voids seen at the skull base.

Ocular globes and orbital contents are unremarkable though not
tailored for evaluation. No abnormal sellar expansion. No suspicious
calvarial bone marrow signal. Craniocervical junction maintained.
Visualized paranasal sinuses and mastoid air cells are well-aerated.

MRA HEAD FINDINGS

Anterior circulation: Normal flow related enhancement of the
included cervical, petrous, cavernous and supraclinoid internal
carotid arteries. Patent anterior communicating artery. Normal flow
related enhancement of the anterior and middle cerebral arteries,
including distal segments.

No large vessel occlusion, high-grade stenosis, abnormal luminal
irregularity, aneurysm.

Posterior circulation: RIGHT vertebral artery is dominant. Basilar
artery is patent, with normal flow related enhancement of the main
branch vessels. Flow related enhancement of the posterior cerebral
arteries. High-grade stenosis LEFT P3 segment.

No large vessel occlusion, abnormal luminal irregularity, aneurysm.
IMPRESSION: MRI HEAD: No acute intracranial process, specifically no acute
ischemia.

Involutional changes and moderate chronic small vessel ischemic
disease. Susceptibility artifact LEFT posterior temporal lobe may
represent old hemorrhage or cavernoma without acute component.

MRA HEAD: No acute large vessel occlusion.

High-grade stenosis LEFT P3 segment.

## 2017-04-19 ENCOUNTER — Ambulatory Visit (INDEPENDENT_AMBULATORY_CARE_PROVIDER_SITE_OTHER): Payer: Medicare Other | Admitting: Orthopaedic Surgery

## 2017-04-19 ENCOUNTER — Ambulatory Visit (INDEPENDENT_AMBULATORY_CARE_PROVIDER_SITE_OTHER): Payer: Medicare Other

## 2017-04-19 ENCOUNTER — Encounter (INDEPENDENT_AMBULATORY_CARE_PROVIDER_SITE_OTHER): Payer: Self-pay | Admitting: Orthopaedic Surgery

## 2017-04-19 VITALS — BP 159/94 | HR 50 | Ht 70.0 in | Wt 160.0 lb

## 2017-04-19 DIAGNOSIS — G8929 Other chronic pain: Secondary | ICD-10-CM

## 2017-04-19 DIAGNOSIS — M25512 Pain in left shoulder: Secondary | ICD-10-CM

## 2017-04-19 NOTE — Progress Notes (Signed)
Office Visit Note   Patient: Christian [name]           Date of Birth: [birth year]           MRN: [id number] Visit Date: 04/19/2017              Requested by: Geoffry ParadiseAronson, Richard, MD [address] [place], [place] [id number] PCP: Geoffry ParadiseAronson, Richard, MD   Assessment & Plan: Visit Diagnoses:  1. Chronic left shoulder pain      Likely recurrent rotator cuff tear with high riding head. Subacromial injection performed with improvement in his pain with steroid does with this.  Plan: Shoulder injection performed with improvement in his pain and try to avoid outstretched overhead activities. He will return if he has persistent symptoms or call we can consider diagnostic imaging to evaluate his recurrent rotator cuff tear with high riding humeral head on plain radiograph.  Follow-Up Instructions: No Follow-up on file.   Orders:  Orders Placed This Encounter  Procedures  . XR Shoulder Left   No orders of the defined types were placed in this encounter.     Procedures: Large Joint Inj Date/Time: 04/19/2017 2:52 PM Performed by: Eldred MangesYATES, Shadd Dunstan C Authorized by: Eldred MangesYATES, Yetta Marceaux C   Consent Given by:  Patient Indications:  Pain Location:  Shoulder Site:  L subacromial bursa Needle Size:  22 G Needle Length:  1.5 inches Ultrasound Guidance: No   Fluoroscopic Guidance: No   Arthrogram: No   Medications:  1 mL lidocaine 1 %; 40 mg methylPREDNISolone acetate 40 MG/ML; 4 mL bupivacaine 0.25 % Aspiration Attempted: No   Patient tolerance:  Patient tolerated the procedure well with no immediate complications     Clinical Data: No additional findings.   Subjective: Chief Complaint  Patient presents with  . Left Shoulder - Pain    HPI 83-year-old male returns and he had previous rotator cuff repair about 18 years ago. He's been active doing yard work doing some lifting of rocks etc. he's noticed over the last several months he cannot get his arm up over his head he has pain and weakness. Pain  rates laterally down toward the deltoid insertion sometimes to the forearm. He denies sharp pain when he rotates his neck. He does well as long as he is not actively moving the shoulder with abduction. He denies fever or chills. Previous rotator cuff incision is well-healed left shoulder. No right shoulder symptoms.no hand numbness.   Review of Systems 14 point review of systems updated patient's had previous total knee arthroplasty on the right previous lumbar 2 level decompression in 2011, history of a TIA, hyperlipidemia and hypertension otherwise negative as it pertains history of present illness.     Objective: Vital Signs: BP (!) 159/94   Pulse (!) 50   Ht 5\' 10"  (1.778 m)   Wt 160 lb (72.6 kg)   BMI 22.96 kg/m   Physical Exam  Constitutional: He is oriented to person, place, and time. He appears well-developed and well-nourished.  HENT:  Head: Normocephalic and atraumatic.  Eyes: Pupils are equal, round, and reactive to light. EOM are normal.  Neck: No tracheal deviation present. No thyromegaly present.  Cardiovascular: Normal rate.   Pulmonary/Chest: Effort normal. He has no wheezes.  Abdominal: Soft. Bowel sounds are normal.  Musculoskeletal:  Healed right shoulder rotator cuff incision. He has weakness with the supraspinatus testing positive drop arm test positive impingement. No rash of the upper extremity elbow reaches full extension no evidence of neural  compression in the forearm or wrist.  Neurological: He is alert and oriented to person, place, and time.  Skin: Skin is warm and dry. Capillary refill takes less than 2 seconds.  Psychiatric: He has a normal mood and affect. His behavior is normal. Judgment and thought content normal.    Ortho Exam  Specialty Comments:  No specialty comments available.  Imaging: No results found.   PMFS History: Patient Active Problem List   Diagnosis Date Noted  . Hyperlipidemia   . Benign essential HTN 12/10/2015  . Left leg  swelling 12/10/2015  . Hypocalcemia 12/10/2015  . TIA (transient ischemic attack) 12/09/2015  . Total knee replacement status 06/06/2015   Past Medical History:  Diagnosis Date  . Arthritis    R knee, L shoulder   . Colon polyps   . Diverticulosis   . Esophageal stricture   . GERD (gastroesophageal reflux disease)   . Hypertension   . Spinal stenosis   . Voice hoarseness     Family History  Problem Relation Age of Onset  . Heart disease Mother     Past Surgical History:  Procedure Laterality Date  . APPENDECTOMY    . BACK SURGERY     x2  . KNEE ARTHROPLASTY Right 06/06/2015   Procedure: RIGHT COMPUTER ASSISTED TOTAL KNEE ARTHROPLASTY;  Surgeon: Eldred Manges, MD;  Location: [facility] OR;  Service: Orthopedics;  Laterality: Right;  . KNEE ARTHROSCOPY Left   . PROSTATE SURGERY    . ROTATOR CUFF REPAIR Left    Social History   Occupational History  . Retired    Social History Main Topics  . Smoking status: Never Smoker  . Smokeless tobacco: Never Used  . Alcohol use No  . Drug use: No  . Sexual activity: Not on file

## 2017-04-26 MED ORDER — METHYLPREDNISOLONE ACETATE 40 MG/ML IJ SUSP
40.0000 mg | INTRAMUSCULAR | Status: AC | PRN
  Administered 2017-04-19: 40 mg via INTRA_ARTICULAR

## 2017-04-26 MED ORDER — BUPIVACAINE HCL 0.25 % IJ SOLN
4.0000 mL | INTRAMUSCULAR | Status: AC | PRN
  Administered 2017-04-19: 4 mL via INTRA_ARTICULAR

## 2017-04-26 MED ORDER — LIDOCAINE HCL 1 % IJ SOLN
1.0000 mL | INTRAMUSCULAR | Status: AC | PRN
  Administered 2017-04-19: 1 mL

## 2019-06-26 ENCOUNTER — Other Ambulatory Visit: Payer: Self-pay | Admitting: Internal Medicine

## 2019-06-26 DIAGNOSIS — R140 Abdominal distension (gaseous): Secondary | ICD-10-CM

## 2019-06-26 DIAGNOSIS — R109 Unspecified abdominal pain: Secondary | ICD-10-CM

## 2019-07-16 DEATH — deceased
